# Patient Record
Sex: Female | Born: 1975 | Hispanic: Yes | Marital: Married | Smoking: Never smoker
Health system: Southern US, Community
[De-identification: ages and names within clinical notes are randomized; demographics above are authoritative.]

## PROBLEM LIST (undated history)

## (undated) DIAGNOSIS — N6320 Unspecified lump in the left breast, unspecified quadrant: Secondary | ICD-10-CM

## (undated) HISTORY — DX: Unspecified lump in the left breast, unspecified quadrant: N63.20

---

## 2010-08-28 HISTORY — PX: OOPHORECTOMY: SHX86

## 2016-08-28 DIAGNOSIS — N6320 Unspecified lump in the left breast, unspecified quadrant: Secondary | ICD-10-CM

## 2016-08-28 HISTORY — DX: Unspecified lump in the left breast, unspecified quadrant: N63.20

## 2016-11-01 ENCOUNTER — Ambulatory Visit: Payer: Self-pay | Attending: Oncology | Admitting: *Deleted

## 2016-11-01 ENCOUNTER — Encounter: Payer: Self-pay | Admitting: *Deleted

## 2016-11-01 VITALS — BP 122/78 | HR 70 | Temp 96.7°F | Resp 18 | Ht 61.42 in | Wt 199.6 lb

## 2016-11-01 DIAGNOSIS — N6452 Nipple discharge: Secondary | ICD-10-CM

## 2016-11-01 DIAGNOSIS — Z0000 Encounter for general adult medical examination without abnormal findings: Secondary | ICD-10-CM

## 2016-11-01 NOTE — Patient Instructions (Signed)
Prueba del VPH (HPV Test) La prueba del virus del papiloma humano (VPH) se [place] para Hydrographic surveyor los tipos de infeccin por el VPH de Public affairs consultant. El VPH es un grupo de alrededor de 100 virus. Muchos de estos virus causan tumores dentro de los genitales, sobre ellos o a su alrededor. La mayora de los VPH provocan infecciones que suelen desaparecen sin tratamiento. Sin embargo, los tipos 6, 64, 46 y 73 del VPH se consideran de Public affairs consultant y pueden aumentar el riesgo de Chief Financial Officer de cuello del tero o de ano si la infeccin no se trata. La prueba del VPH identifica las cadenas de ADN (genticas) de la infeccin por el VPH, por lo que tambin se denomina prueba de [place] para [employer]. Aunque el VPH se Secretary/administrator en los hombres como en las mujeres, la prueba del VPH se [place] solo para Hydrographic surveyor un mayor riesgo de cncer en las mujeres:  Con una prueba de Papanicolaou anormal.  Despus del tratamiento de una prueba de Papanicolaou anormal.  Entre 30y 65aos.  Despus del tratamiento de una infeccin por el VPH de [employer]. La prueba del VPH se puede hacer al mismo tiempo que un examen plvico y Ardelia Mems prueba de Papanicolaou en mujeres de ms de 30 aos. Tanto la prueba del VPH como la prueba de Papanicolaou requieren Truddie Coco de clulas del cuello del tero. PREPARACIN PARA EL ESTUDIO  No se haga lavados vaginales ni se d un bao durante 24 a 48 horas antes de la prueba o como se lo haya indicado el mdico.  No tenga sexo durante 24 a 48 horas antes de la prueba o como se lo haya indicado el mdico.  Es posible que se le pida que reprograme la prueba si est menstruando.  Se le pedir que orine antes de la prueba. RESULTADOS Es su responsabilidad retirar el resultado del [place]. Consulte en el laboratorio o en el departamento en el que fue realizado el estudio cundo y cmo podr [employer]. Hable con el mdico si tiene [employer]. El resultado ser  negativo o positivo. Significado de los [employer] de la prueba Un resultado negativo de la prueba del VPH significa que no se detect el VPH, y es muy probable que no tenga el virus. Significado de los [employer] de la prueba Un resultado positivo de la prueba del VPH indica que tiene el virus.  Si el resultado de la prueba [place] la presencia de [place] cadena del VPH de alto riesgo, puede tener mayor riesgo de Chief Financial Officer de cuello del tero o de ano si la infeccin no se trata.  Si se encuentran cadenas del VPH de bajo riesgo, es poco probable que tenga un alto riesgo de Chief Financial Officer. Hable con el mdico [employer]. El mdico [employer] para Optometrist un diagnstico y Teacher, adult education un plan de tratamiento adecuado para usted. Esta informacin no tiene Marine scientist el consejo del mdico. Asegrese de hacerle al mdico cualquier pregunta que tenga. Document Released: 11/30/2008 Document Revised: 09/04/2014 Document Reviewed: 12/30/2013 Elsevier Interactive Patient Education  2017 [place] patient hand-out, Women Staying Healthy, Active and Well from [employer], with education on breast health, pap smears, heart and colon health.

## 2016-11-01 NOTE — Progress Notes (Signed)
Subjective:     Patient ID: Kirsten Martin, female   DOB: 1976/02/02, 41 y.o.   MRN: [id number]  HPI   Review of Systems     Objective:   Physical Exam  Pulmonary/Chest: Right breast exhibits no inverted nipple, no mass, no nipple discharge, no skin change and no tenderness. Left breast exhibits nipple discharge. Left breast exhibits no inverted nipple, no mass, no skin change and no tenderness. Breasts are symmetrical.  Patient states spontaneous yellow nipple discharge from the left breast for 6 months  Abdominal: There is no splenomegaly or hepatomegaly.  Genitourinary: No labial fusion. There is no rash, tenderness, lesion or injury on the right labia. There is no rash, tenderness, lesion or injury on the left labia. Cervix exhibits no motion tenderness, no discharge and no friability. Right adnexum displays no mass, no tenderness and no fullness. Left adnexum displays no mass, no tenderness and no fullness. No erythema, tenderness or bleeding in the vagina. No foreign body in the vagina. No signs of injury around the vagina. No vaginal discharge found.       Assessment:     41 year old Hispanic female presents to [facility] for clinical breast exam, mammogram and pap smear.  [place], the interpreter present during the interview and exam.  Patient states she has had spontaneous yellow left nipple discharge for about 6 months.  States she finds her bras wet, and her night clothes wet at her left breast after sleeping through the night.  On clinical breast exam there is no dominant mass, skin changes or nipple discharge.  Taught self breast awareness.  Last pap was completed 2015.  There are no pap results for review, so I will proceed with collecting her pap smear today.  Specimen collected for pap.  Patient has been screened for eligibility.  She does not have any insurance, Medicare or Medicaid.  She also meets financial eligibility.  Hand-out given on the Affordable Care Act.    Plan:       Bilateral diagnositic mammogram and ultrasound ordered for her left nipple discharge.  Jeanella Anton to schedule patient her appointment.  Specimen for pap sent to the lab.  Will follow-up per [facility] protocol.

## 2016-11-03 LAB — PAP LB AND HPV HIGH-RISK
HPV, HIGH-RISK: NEGATIVE
PAP Smear Comment: 0

## 2016-11-10 ENCOUNTER — Ambulatory Visit
Admission: RE | Admit: 2016-11-10 | Discharge: 2016-11-10 | Disposition: A | Discharge status: Home / Self Care | Payer: Self-pay | Source: Ambulatory Visit | Attending: Oncology | Admitting: Oncology

## 2016-11-10 DIAGNOSIS — N6452 Nipple discharge: Secondary | ICD-10-CM

## 2016-11-13 ENCOUNTER — Other Ambulatory Visit: Payer: Self-pay | Admitting: *Deleted

## 2016-11-13 DIAGNOSIS — N63 Unspecified lump in unspecified breast: Secondary | ICD-10-CM

## 2016-11-22 ENCOUNTER — Ambulatory Visit
Admission: RE | Admit: 2016-11-22 | Discharge: 2016-11-22 | Disposition: A | Discharge status: Home / Self Care | Payer: Self-pay | Source: Ambulatory Visit | Attending: Oncology | Admitting: Oncology

## 2016-11-22 DIAGNOSIS — N63 Unspecified lump in unspecified breast: Secondary | ICD-10-CM

## 2016-11-22 HISTORY — PX: BREAST BIOPSY: SHX20

## 2016-11-23 LAB — SURGICAL PATHOLOGY

## 2016-11-27 ENCOUNTER — Telehealth: Payer: Self-pay | Admitting: *Deleted

## 2016-11-27 NOTE — Telephone Encounter (Signed)
Called patient via Herb Grays, the interpreter.  Reviewed her benign biopsy results and need for a surgical consult.  Patient is scheduled to see Dr. Jamal Collin on 11/29/16 at 1:15  She is to arrive 30 minutes early to complete paperwork.  She is to take a photo ID and all her meds with her to the appointment.  She is to call if she has any questions or needs.

## 2016-11-29 ENCOUNTER — Encounter: Payer: Self-pay | Admitting: General Surgery

## 2016-11-29 ENCOUNTER — Ambulatory Visit (INDEPENDENT_AMBULATORY_CARE_PROVIDER_SITE_OTHER): Payer: PRIVATE HEALTH INSURANCE | Admitting: General Surgery

## 2016-11-29 ENCOUNTER — Other Ambulatory Visit: Payer: Self-pay | Admitting: *Deleted

## 2016-11-29 ENCOUNTER — Telehealth: Payer: Self-pay | Admitting: *Deleted

## 2016-11-29 VITALS — BP 132/78 | HR 74 | Resp 12 | Ht 63.0 in | Wt 201.0 lb

## 2016-11-29 DIAGNOSIS — N6321 Unspecified lump in the left breast, upper outer quadrant: Secondary | ICD-10-CM

## 2016-11-29 DIAGNOSIS — N63 Unspecified lump in unspecified breast: Secondary | ICD-10-CM

## 2016-11-29 DIAGNOSIS — N6320 Unspecified lump in the left breast, unspecified quadrant: Secondary | ICD-10-CM

## 2016-11-29 NOTE — Progress Notes (Signed)
Patient ID: Kirsten Martin, female   DOB: 06-May-1976, 41 y.o.   MRN: [id number]  Chief Complaint  Patient presents with  . Other    biopsy done 11/22/16    HPI Kirsten Martin is a 41 y.o. female who presents for a breast evaluation. The most recent mammogram was done on 11/10/16. Patient reports she had some left nipple discharge for about 6 months this occurred daily. It first started out as yellow discharge then changed to red/bloody. Patient does perform regular self breast checks and gets regular mammograms done. Daughter Kirsten Martin is present. She had a biopsy done 11/22/16 which she is here to discuss recommendations.   Denies family history of cancer.  [place] with [facility]S present for interview exam and discussion.       HPI  No past medical history on file.  Past Surgical History:  Procedure Laterality Date  . BREAST BIOPSY Left 11/22/2016   [place] bx of 2 areas. Path pending  . OOPHORECTOMY Left 2012   [facility]    No family history on file.  Social History Social History  Substance Use Topics  . Smoking status: Never Smoker  . Smokeless tobacco: Never Used  . Alcohol use No    No Known Allergies  No current outpatient prescriptions on file.   No current facility-administered medications for this visit.     Review of Systems Review of Systems  Constitutional: Negative.   Respiratory: Negative.   Cardiovascular: Negative.     Blood pressure 132/78, pulse 74, resp. rate 12, height 5\' 3"  (1.6 m), weight 201 lb (91.2 kg).  Physical Exam Physical Exam  Constitutional: She is oriented to person, place, and time. She appears well-developed and well-nourished.  Eyes: Conjunctivae are normal. No scleral icterus.  Cardiovascular: Normal rate, regular rhythm and normal heart sounds.   Pulmonary/Chest: Effort normal and breath sounds normal. Right breast exhibits no inverted nipple, no mass, no nipple discharge, no skin change and no  tenderness. Left breast exhibits no inverted nipple, no mass, no nipple discharge, no skin change and no tenderness.  Well healed incision   Lymphadenopathy:    She has no cervical adenopathy.    She has no axillary adenopathy.  Neurological: She is alert and oriented to person, place, and time.  Skin: Skin is warm and dry.    Data Reviewed Mammogram and ultrasound reviewed 2 areas of distortion seen on mammogram of left breast. Larger area was seen on [place] but not the smaller area. 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 [place] also showed large duct associated with this mass. The smaller area of distortion was not seen on [place] and needs to be biopsied as there was more concern for this to be malignant Assessment    Nipple discharge. Intraductal papilloma    Plan    Advised patient on need for tomo guided biopsy on smaller mass located at 12 o'clock. Explained procedure and need for excision of area of papilloma    also.  Follow up after procedure.      This information has been scribed by Verlene Mayer, CMA   I have completed the exam and reviewed the above documentation for accuracy and completeness.  I agree with the above.  Haematologist has been used and any errors in dictation or transcription are unintentional.  Seeplaputhur G. Jamal Collin, M.D., F.A.C.S.  Junie Panning G 12/04/2016, 2:52 PM

## 2016-11-29 NOTE — Patient Instructions (Addendum)
Schedule tomo-guided biopsy

## 2016-11-29 NOTE — Telephone Encounter (Signed)
Message left for daughter,Jessica, to call the office regarding patient's upcoming appointments.

## 2016-11-29 NOTE — Telephone Encounter (Signed)
Patient's daughter called back and was notified of biopsy date and time as well as follow up appointment with Dr. Jamal Collin.

## 2016-12-07 ENCOUNTER — Ambulatory Visit
Admission: RE | Admit: 2016-12-07 | Discharge: 2016-12-07 | Disposition: A | Discharge status: Home / Self Care | Payer: Self-pay | Source: Ambulatory Visit | Attending: Oncology | Admitting: Oncology

## 2016-12-07 DIAGNOSIS — N63 Unspecified lump in unspecified breast: Secondary | ICD-10-CM

## 2016-12-07 HISTORY — PX: BREAST BIOPSY: SHX20

## 2016-12-08 LAB — SURGICAL PATHOLOGY

## 2016-12-11 ENCOUNTER — Encounter: Payer: Self-pay | Admitting: General Surgery

## 2016-12-11 ENCOUNTER — Ambulatory Visit (INDEPENDENT_AMBULATORY_CARE_PROVIDER_SITE_OTHER): Payer: PRIVATE HEALTH INSURANCE | Admitting: General Surgery

## 2016-12-11 VITALS — BP 120/78 | HR 80 | Resp 12 | Ht 60.0 in | Wt 202.0 lb

## 2016-12-11 DIAGNOSIS — D242 Benign neoplasm of left breast: Secondary | ICD-10-CM | POA: Diagnosis not present

## 2016-12-11 NOTE — Progress Notes (Signed)
Patient ID: Kirsten Martin, female   DOB: 1975/11/13, 41 y.o.   MRN: [id number]  Chief Complaint  Patient presents with  . Follow-up    HPI Kirsten Martin is a 41 y.o. female here today for her follow up left breast biopsy done on 12/07/2016. Patient states she is doing well. Denies pain, fever, nausea. She feels a hardened area where the biopsy was done. Interpreter Ronnald Collum is present at visit.  HPI  No past medical history on file.  Past Surgical History:  Procedure Laterality Date  . BREAST BIOPSY Left 11/22/2016   [place] bx of 2 areas. Path pending  . BREAST BIOPSY Left 12/07/2016   path pending  . OOPHORECTOMY Left 2012   [facility]    No family history on file.  Social History Social History  Substance Use Topics  . Smoking status: Never Smoker  . Smokeless tobacco: Never Used  . Alcohol use No    No Known Allergies  No current outpatient prescriptions on file.   No current facility-administered medications for this visit.     Review of Systems Review of Systems  Constitutional: Negative.   Respiratory: Negative.   Cardiovascular: Negative.     Blood pressure 120/78, pulse 80, resp. rate 12, height 5' (1.524 m), weight 202 lb (91.6 kg), last menstrual period 12/04/2016.  Physical Exam Physical Exam  Constitutional: She appears well-developed and well-nourished.  Pulmonary/Chest:    Abdominal: Soft.    Data Reviewed Pathology, left breast UIQ tomographic guided stereotactic biopsy from 12/07/16 showed [place] and columnar cell changes Pathology, US guided biopsy from 11/22/16: left breast 11:00 showed [place] and columnar cell change Pathology, US guided biopsy from 11/22/16: left breast 12:00 showed intraductal papilloma  Assessment    The only area of concern is the lesion at 12:00 showing a papilloma. This does warrant excision, however with the current hematoma would prefer to wait until the hematoma resolves to perform the surgery. This was all discussed  fully with the radiologist.    Patient is agreeable.  Plan    Patient to return in 1 month. Advised the use of heat and a snug bra, and to call if there's any worsening of the swelling in the left breast.       I have completed the exam and reviewed the above documentation for accuracy and completeness.  I agree with the above.  Haematologist has been used and any errors in dictation or transcription are unintentional.  Seeplaputhur G. Jamal Collin, M.D., F.A.C.S.  Junie Panning G 12/11/2016, 3:06 PM

## 2017-01-16 ENCOUNTER — Encounter: Payer: Self-pay | Admitting: General Surgery

## 2017-01-16 ENCOUNTER — Ambulatory Visit (INDEPENDENT_AMBULATORY_CARE_PROVIDER_SITE_OTHER): Payer: Self-pay | Admitting: General Surgery

## 2017-01-16 VITALS — BP 124/86 | HR 76 | Resp 12 | Ht 60.0 in | Wt 199.0 lb

## 2017-01-16 DIAGNOSIS — N6452 Nipple discharge: Secondary | ICD-10-CM

## 2017-01-16 DIAGNOSIS — D369 Benign neoplasm, unspecified site: Secondary | ICD-10-CM

## 2017-01-16 NOTE — Patient Instructions (Addendum)
The patient is aware to call back for any questions or concerns.  The patient is scheduled for surgery at [facility] on 01/26/17. She will pre admit at the hospital on 01/23/17. The patient is aware of dates, time, and instructions.

## 2017-01-16 NOTE — Progress Notes (Signed)
Patient ID: Kirsten Martin, female   DOB: 1975-09-08, 41 y.o.   MRN: [id number]  Chief Complaint  Patient presents with  . Follow-up    HPI Kirsten Martin is a 41 y.o. female.  Here today for follow up left breat biopsy. She states the area is sore. Slight nipple discharge last week.  Interpreter, Chip Boer present for interview, exam and discussion.  HPI  Past Medical History:  Diagnosis Date  . Breast mass, left 2018    Past Surgical History:  Procedure Laterality Date  . BREAST BIOPSY Left 11/22/2016   [place] bx of 2 areas. FRAGMENTS OF SCLEROSING INTRADUCTAL PAPILLOMA.  Marland Kitchen BREAST BIOPSY Left 12/07/2016   PSEUDO-ANGIOMATOUS STROMAL HYPERPLASIA  . OOPHORECTOMY Left 2012   [facility]    No family history on file.  Social History Social History  Substance Use Topics  . Smoking status: Never Smoker  . Smokeless tobacco: Never Used  . Alcohol use No    No Known Allergies  No current outpatient prescriptions on file.   No current facility-administered medications for this visit.     Review of Systems Review of Systems  Constitutional: Negative.   Respiratory: Negative.   Cardiovascular: Negative.     Blood pressure 124/86, pulse 76, resp. rate 12, height 5' (1.524 m), weight 199 lb (90.3 kg), last menstrual period 11/30/2016.  Physical Exam Physical Exam  Constitutional: She is oriented to person, place, and time. She appears well-developed and well-nourished.  HENT:  Mouth/Throat: Oropharynx is clear and moist.  Eyes: Conjunctivae are normal. No scleral icterus.  Neck: Neck supple.  Cardiovascular: Normal rate, regular rhythm and normal heart sounds.   Pulmonary/Chest: Effort normal and breath sounds normal. Right breast exhibits no inverted nipple, no mass, no nipple discharge, no skin change and no tenderness. Left breast exhibits no inverted nipple, no mass, no nipple discharge, no skin change and no tenderness.  Abdominal: Soft.  Lymphadenopathy:    She has  no cervical adenopathy.    She has no axillary adenopathy.  Neurological: She is alert and oriented to person, place, and time.  Skin: Skin is warm and dry.  Psychiatric: Her behavior is normal.  Hematoma from recent left breast biopsy at 11-12 ocl location has resolved  Data Reviewed Progress notes and previous pathology.   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   Pt is agreeable.  HPI, Physical Exam, Assessment and Plan have been scribed under the direction and in the presence of Mckinley Jewel, MD  Karie Fetch, RN I have completed the exam and reviewed the above documentation for accuracy and completeness.  I agree with the above.  Haematologist has been used and any errors in dictation or transcription are unintentional.  Atziri Zubiate G. Jamal Collin, M.D., F.A.C.S.   Junie Panning G 01/17/2017, 8:45 AM

## 2017-01-17 ENCOUNTER — Other Ambulatory Visit: Payer: Self-pay | Admitting: General Surgery

## 2017-01-17 ENCOUNTER — Telehealth: Payer: Self-pay | Admitting: *Deleted

## 2017-01-17 DIAGNOSIS — D369 Benign neoplasm, unspecified site: Secondary | ICD-10-CM

## 2017-01-17 NOTE — Telephone Encounter (Signed)
-----   Message from Sherrie Sport sent at 01/17/2017  9:29 AM EDT ----- Regarding: RE: Wire Loc  8:15am check in at [facility].  I will request an interpreter.  Thanks  ----- Message ----- From: Lesly Rubenstein, LPN Sent: [id number]   4:00 PM To: Dominga Ferry, CMA, Ileene Hutchinson, RT, # Subject: Wire Loc                                       Patient is scheduled for excision of left breast mass and left wire loc on 01/26/17. Surgery time is 11:35 am. Just need to know what time she needs to report to [facility]. Also patient will need a spanish interpreter.

## 2017-01-17 NOTE — Addendum Note (Signed)
Addended by: Christene Lye on: 01/17/2017 01:39 PM   Modules accepted: Orders, SmartSet

## 2017-01-17 NOTE — Telephone Encounter (Signed)
Patient notified of surgery date and time via [employer] [phone].   Also, aware of pre-admission date and time.   She verbalizes understanding. No questions at this time.

## 2017-01-23 ENCOUNTER — Encounter
Admission: RE | Admit: 2017-01-23 | Discharge: 2017-01-23 | Disposition: A | Discharge status: Home / Self Care | Payer: Self-pay | Source: Ambulatory Visit | Attending: General Surgery | Admitting: General Surgery

## 2017-01-23 DIAGNOSIS — Z01818 Encounter for other preprocedural examination: Secondary | ICD-10-CM | POA: Insufficient documentation

## 2017-01-23 DIAGNOSIS — D242 Benign neoplasm of left breast: Secondary | ICD-10-CM | POA: Insufficient documentation

## 2017-01-23 LAB — HEMOGLOBIN: HEMOGLOBIN: 14.2 g/dL (ref 12.0–16.0)

## 2017-01-23 NOTE — Patient Instructions (Signed)
Your procedure is scheduled on: 01/26/17 Su procedimiento est programado para: Report to [place] [facility]:   Remember: Instructions that are not followed completely may result in serious medical risk, up to and including death, or upon the discretion of your surgeon and anesthesiologist your surgery may need to be rescheduled.  Recuerde: Las instrucciones que no se siguen completamente Heritage manager en un riesgo de salud grave, incluyendo hasta la [place] o a discrecin de su cirujano y Environmental health practitioner, su ciruga se puede posponer.   __X__ 1. Do not eat food or drink liquids after midnight. No gum chewing or hard candies.  No coma alimentos ni tome lquidos despus de la medianoche.  No mastique chicle ni caramelos  duros.     __X__ 2. No alcohol for 24 hours before or after surgery.    No tome alcohol durante las 24 horas antes ni despus de la [place].   ____ 3. Bring all medications with you on the day of surgery if instructed.    Lleve todos los medicamentos con usted el da de su ciruga si se le ha indicado as.   __X__ 4. Notify your doctor if there is any change in your medical condition (cold, fever,                             infections).    Informe a su mdico si hay algn cambio en su condicin mdica (resfriado, fiebre, infecciones).   Do not wear jewelry, make-up, hairpins, clips or nail polish.  No use joyas, maquillajes, pinzas/ganchos para el cabello ni esmalte de uas.  Do not wear lotions, powders, or perfumes. You may wear deodorant.  No use lociones, polvos o perfumes.  Puede usar desodorante.    Do not shave 48 hours prior to surgery. Men may shave face and neck.  No se afeite 48 horas antes de la [place].  Los hombres pueden [employer] cara y el cuello.   Do not bring valuables to the hospital.   No lleve objetos [facility] is not responsible for any belongings or valuables.  [place] no se  hace responsable de ningn tipo de pertenencias u objetos de Geographical information systems officer.               Contacts, dentures or bridgework may not be worn into surgery.  Los lentes de [place], las dentaduras postizas o puentes no se pueden usar en la [place].  Leave your suitcase in the car. After surgery it may be brought to your room.  Deje su maleta en el auto.  Despus de la ciruga podr traerla a su habitacin.  For patients admitted to the hospital, discharge time is determined by your treatment team.  Para los pacientes que sean ingresados al hospital, el tiempo en el cual se le dar de alta es determinado por su                equipo de [place].   Patients discharged the day of surgery will not be allowed to drive home. A los pacientes que se les da de alta el mismo da de la ciruga no se les permitir conducir a Holiday representative.   Please read over the following fact sheets that you were given: Por favor [place] hojas de informacin que le dieron:     __X__ Take these medicines the morning of surgery with A SIP  OF WATER:          [employer] medicinas la maana de la ciruga con UN SORBO DE AGUA:  1. NONE NADA  2.   3.   4.       5.  6.  ____ Fleet Enema (as directed)          Enema de Fleet (segn lo indicado)    __X__ Use CHG Soap as directed          Utilice el jabn de CHG segn lo indicado  ____ Use inhalers on the day of surgery          Use los inhaladores el da de la ciruga  ____ Stop metformin 2 days prior to surgery          Deje de tomar el metformin 2 das antes de la ciruga    ____ Take 1/2 of usual insulin dose the night before surgery and none on the morning of surgery           Tome la mitad de la dosis habitual de insulina la noche antes de la [place] y no tome nada en la maana de la             ciruga  ____ Stop Coumadin/Plavix/aspirin on           Deje de tomar el Coumadin/Plavix/aspirina el da:  ____ Stop Anti-inflammatories on           Deje de tomar  antiinflamatorios el da:   ____ Stop supplements until after surgery            Deje de tomar suplementos hasta despus de la ciruga  ____ Bring C-Pap to the hospital          [place] al hospital

## 2017-01-26 ENCOUNTER — Ambulatory Visit
Admission: RE | Admit: 2017-01-26 | Discharge: 2017-01-26 | Disposition: A | Discharge status: Home / Self Care | Payer: Self-pay | Source: Ambulatory Visit | Attending: General Surgery | Admitting: General Surgery

## 2017-01-26 ENCOUNTER — Ambulatory Visit: Payer: Self-pay | Admitting: Anesthesiology

## 2017-01-26 ENCOUNTER — Encounter: Payer: Self-pay | Admitting: Anesthesiology

## 2017-01-26 ENCOUNTER — Encounter
Admission: RE | Disposition: A | Discharge status: Home / Self Care | Payer: Self-pay | Source: Ambulatory Visit | Attending: General Surgery

## 2017-01-26 DIAGNOSIS — D369 Benign neoplasm, unspecified site: Secondary | ICD-10-CM

## 2017-01-26 DIAGNOSIS — D242 Benign neoplasm of left breast: Secondary | ICD-10-CM | POA: Insufficient documentation

## 2017-01-26 HISTORY — PX: BREAST LUMPECTOMY WITH NEEDLE LOCALIZATION: SHX5759

## 2017-01-26 HISTORY — PX: BREAST LUMPECTOMY: SHX2

## 2017-01-26 LAB — POCT PREGNANCY, URINE: Preg Test, Ur: NEGATIVE

## 2017-01-26 SURGERY — BREAST LUMPECTOMY WITH NEEDLE LOCALIZATION
Anesthesia: General | Laterality: Left | Wound class: Clean

## 2017-01-26 MED ORDER — GLYCOPYRROLATE 0.2 MG/ML IJ SOLN
INTRAMUSCULAR | Status: AC
  Filled 2017-01-26: qty 1

## 2017-01-26 MED ORDER — PROPOFOL 10 MG/ML IV BOLUS
INTRAVENOUS | Status: AC
  Filled 2017-01-26: qty 20

## 2017-01-26 MED ORDER — LIDOCAINE HCL (CARDIAC) 20 MG/ML IV SOLN
INTRAVENOUS | Status: DC | PRN
  Administered 2017-01-26: 100 mg via INTRAVENOUS

## 2017-01-26 MED ORDER — OXYCODONE HCL 5 MG/5ML PO SOLN: 5.0000 mg | Freq: Once | ORAL | Status: DC | PRN

## 2017-01-26 MED ORDER — GLYCOPYRROLATE 0.2 MG/ML IJ SOLN
INTRAMUSCULAR | Status: DC | PRN
  Administered 2017-01-26: 0.2 mg via INTRAVENOUS

## 2017-01-26 MED ORDER — LACTATED RINGERS IV SOLN
INTRAVENOUS | Status: DC
  Administered 2017-01-26: 10:00:00 via INTRAVENOUS

## 2017-01-26 MED ORDER — MIDAZOLAM HCL 2 MG/2ML IJ SOLN
INTRAMUSCULAR | Status: DC | PRN
  Administered 2017-01-26: 2 mg via INTRAVENOUS

## 2017-01-26 MED ORDER — FAMOTIDINE 20 MG PO TABS
20.0000 mg | ORAL_TABLET | Freq: Once | ORAL | Status: AC
  Administered 2017-01-26: 20 mg via ORAL

## 2017-01-26 MED ORDER — ACETAMINOPHEN 10 MG/ML IV SOLN
INTRAVENOUS | Status: AC
  Filled 2017-01-26: qty 100

## 2017-01-26 MED ORDER — ONDANSETRON HCL 4 MG/2ML IJ SOLN
INTRAMUSCULAR | Status: DC | PRN
  Administered 2017-01-26: 4 mg via INTRAVENOUS

## 2017-01-26 MED ORDER — MIDAZOLAM HCL 2 MG/2ML IJ SOLN
INTRAMUSCULAR | Status: AC
  Filled 2017-01-26: qty 2

## 2017-01-26 MED ORDER — FAMOTIDINE 20 MG PO TABS
ORAL_TABLET | ORAL | Status: AC
  Filled 2017-01-26: qty 1

## 2017-01-26 MED ORDER — BUPIVACAINE HCL (PF) 0.5 % IJ SOLN
INTRAMUSCULAR | Status: DC | PRN
  Administered 2017-01-26: 20 mL

## 2017-01-26 MED ORDER — FENTANYL CITRATE (PF) 100 MCG/2ML IJ SOLN
INTRAMUSCULAR | Status: DC | PRN
  Administered 2017-01-26: 100 ug via INTRAVENOUS

## 2017-01-26 MED ORDER — DEXAMETHASONE SODIUM PHOSPHATE 10 MG/ML IJ SOLN
INTRAMUSCULAR | Status: DC | PRN
  Administered 2017-01-26: 10 mg via INTRAVENOUS

## 2017-01-26 MED ORDER — LIDOCAINE HCL (PF) 2 % IJ SOLN
INTRAMUSCULAR | Status: AC
  Filled 2017-01-26: qty 2

## 2017-01-26 MED ORDER — PROPOFOL 10 MG/ML IV BOLUS
INTRAVENOUS | Status: DC | PRN
  Administered 2017-01-26: 150 mg via INTRAVENOUS

## 2017-01-26 MED ORDER — FENTANYL CITRATE (PF) 100 MCG/2ML IJ SOLN
INTRAMUSCULAR | Status: AC
  Filled 2017-01-26: qty 2

## 2017-01-26 MED ORDER — FENTANYL CITRATE (PF) 100 MCG/2ML IJ SOLN
25.0000 ug | INTRAMUSCULAR | Status: DC | PRN
  Administered 2017-01-26 (×4): 25 ug via INTRAVENOUS

## 2017-01-26 MED ORDER — BUPIVACAINE HCL (PF) 0.5 % IJ SOLN
INTRAMUSCULAR | Status: AC
  Filled 2017-01-26: qty 30

## 2017-01-26 MED ORDER — ONDANSETRON HCL 4 MG/2ML IJ SOLN
INTRAMUSCULAR | Status: AC
  Filled 2017-01-26: qty 2

## 2017-01-26 MED ORDER — CHLORHEXIDINE GLUCONATE CLOTH 2 % EX PADS
6.0000 | MEDICATED_PAD | Freq: Once | CUTANEOUS | Status: AC
  Administered 2017-01-26: 6 via TOPICAL

## 2017-01-26 MED ORDER — TRAMADOL HCL 50 MG PO TABS: 50.0000 mg | ORAL_TABLET | Freq: Four times a day (QID) | ORAL | 0 refills | Status: DC | PRN

## 2017-01-26 MED ORDER — DEXAMETHASONE SODIUM PHOSPHATE 10 MG/ML IJ SOLN
INTRAMUSCULAR | Status: AC
  Filled 2017-01-26: qty 1

## 2017-01-26 MED ORDER — OXYCODONE HCL 5 MG PO TABS: 5.0000 mg | ORAL_TABLET | Freq: Once | ORAL | Status: DC | PRN

## 2017-01-26 MED ORDER — ACETAMINOPHEN 10 MG/ML IV SOLN
INTRAVENOUS | Status: DC | PRN
  Administered 2017-01-26: 1000 mg via INTRAVENOUS

## 2017-01-26 MED ORDER — PHENYLEPHRINE HCL 10 MG/ML IJ SOLN
INTRAMUSCULAR | Status: DC | PRN
  Administered 2017-01-26 (×5): 100 ug via INTRAVENOUS

## 2017-01-26 MED ORDER — LIDOCAINE HCL (PF) 1 % IJ SOLN
INTRAMUSCULAR | Status: AC
  Filled 2017-01-26: qty 30

## 2017-01-26 SURGICAL SUPPLY — 36 items
BLADE SURG 15 STRL SS SAFETY (BLADE) ×2 IMPLANT
CANISTER SUCT 1200ML W/VALVE (MISCELLANEOUS) ×2 IMPLANT
CHLORAPREP W/TINT 26ML (MISCELLANEOUS) ×2 IMPLANT
COVER PROBE FLX POLY STRL (MISCELLANEOUS) ×2 IMPLANT
DERMABOND ADVANCED (GAUZE/BANDAGES/DRESSINGS) ×1
DERMABOND ADVANCED .7 DNX12 (GAUZE/BANDAGES/DRESSINGS) ×1 IMPLANT
DEVICE DUBIN SPECIMEN MAMMOGRA (MISCELLANEOUS) ×2 IMPLANT
DEVICE LOCALIZATION ULTRAWIRE (WIRE) IMPLANT
DEVICE ULTRAWIRE LOCAL 19X9 (WIRE) IMPLANT
DRAPE LAPAROTOMY 100X77 ABD (DRAPES) ×2 IMPLANT
ELECT CAUTERY BLADE TIP 2.5 (TIP) ×2
ELECT REM PT RETURN 9FT ADLT (ELECTROSURGICAL) ×2
ELECTRODE CAUTERY BLDE TIP 2.5 (TIP) ×1 IMPLANT
ELECTRODE REM PT RTRN 9FT ADLT (ELECTROSURGICAL) ×1 IMPLANT
GLOVE BIO SURGEON STRL SZ7 (GLOVE) ×16 IMPLANT
GOWN STRL REUS W/ TWL LRG LVL3 (GOWN DISPOSABLE) ×4 IMPLANT
GOWN STRL REUS W/TWL LRG LVL3 (GOWN DISPOSABLE) ×4
KIT RM TURNOVER STRD PROC AR (KITS) ×2 IMPLANT
LABEL OR SOLS (LABEL) ×2 IMPLANT
MARGIN MAP 10MM (MISCELLANEOUS) ×2 IMPLANT
NDL HPO THNWL 1X22GA REG BVL (NEEDLE) ×1 IMPLANT
NEEDLE HYPO 25X1 1.5 SAFETY (NEEDLE) ×2 IMPLANT
NEEDLE SAFETY 22GX1 (NEEDLE) ×1
PACK BASIN MINOR ARMC (MISCELLANEOUS) ×2 IMPLANT
SUT ETHILON 3-0 FS-10 30 BLK (SUTURE) ×2
SUT VIC AB 2-0 CT2 27 (SUTURE) ×2 IMPLANT
SUT VIC AB 2-0 SH 27 (SUTURE) ×1
SUT VIC AB 2-0 SH 27XBRD (SUTURE) ×1 IMPLANT
SUT VIC AB 3-0 54X BRD REEL (SUTURE) ×1 IMPLANT
SUT VIC AB 3-0 BRD 54 (SUTURE) ×1
SUT VIC AB 4-0 FS2 27 (SUTURE) ×2 IMPLANT
SUTURE EHLN 3-0 FS-10 30 BLK (SUTURE) ×1 IMPLANT
SYR CONTROL 10ML (SYRINGE) ×2 IMPLANT
ULTRAWIRE LOCAL DEVICE 19X9 (WIRE)
ULTRAWIRE LOCALIZATION DEVICE (WIRE)
WATER STERILE IRR 1000ML POUR (IV SOLUTION) ×2 IMPLANT

## 2017-01-26 NOTE — Transfer of Care (Signed)
Immediate Anesthesia Transfer of Care Note  Patient: Kirsten Martin  Procedure(s) Performed: Procedure(s): BREAST LUMPECTOMY WITH NEEDLE LOCALIZATION (Left)  Patient Location: PACU  Anesthesia Type:General  Level of Consciousness: sedated  Airway & Oxygen Therapy: Patient Spontanous Breathing and Patient connected to face mask oxygen  Post-op Assessment: Report given to RN and Post -op Vital signs reviewed and stable  Post vital signs: Reviewed and stable  Last Vitals:  Vitals:   01/26/17 0928 01/26/17 1106  BP: 128/72 (P) 129/80  Pulse: (!) 56   Resp: 16 (P) 20  Temp: 36.8 C (P) 36.1 C    Last Pain: There were no vitals filed for this visit.       Complications: No apparent anesthesia complications

## 2017-01-26 NOTE — Interval H&P Note (Signed)
History and Physical Interval Note:  01/26/2017 9:26 AM  Kirsten Martin  has presented today for surgery, with the diagnosis of papilloma  The various methods of treatment have been discussed with the patient and family. After consideration of risks, benefits and other options for treatment, the patient has consented to  Procedure(s): BREAST LUMPECTOMY WITH NEEDLE LOCALIZATION (Left) as a surgical intervention .  The patient's history has been reviewed, patient examined, no change in status, stable for surgery.  I have reviewed the patient's chart and labs.  Questions were answered to the patient's satisfaction.     SANKAR,SEEPLAPUTHUR G

## 2017-01-26 NOTE — Op Note (Signed)
Preop diagnosis: Intraductal papilloma left breast  Post op diagnosis: Same  Operation: Excision/lumpectomy left breast 12:00 location  Surgeon: Mckinley Jewel  Assistant:     Anesthesia: Gen.  Complications: None  EBL: Minimal  Drains: None  Description: This patient had previously undergone core biopsies of multiple lesions in the left breast. The lesion located at 12:00 5 cm from the nipple was reported as a papilloma and excision of therefore planned. The patient preoperatively underwent wire localization of the wing-shaped clip denoting the location of the papilloma in the superior left breast. She was brought the operating room put to sleep with an LMA and the left breast prepped and draped as sterile field. Timeout was performed. A curvilinear incision just below the wire entrance was made and the skin and subcutaneous tissue with an elevated on both sides. The wire was freed from the skin and subcutaneous tissue. With the help of the the image after wire placement and palpation and visualization and a core of tissue surrounding the wire was excised out using cautery. Specimen mammogram was obtained showing the clip to be well within the specimen. After ensuring hemostasis the gland was approximated with interrupted 2-0 Vicryl. Subcutaneous tissue also closed with 2-0 Vicryl. Skin closed with subcuticular 4-0 Vicryl reinforced with the Dermabond. Procedure was well-tolerated with no immediate problems encountered and patient subsequently returned recovery room stable condition.

## 2017-01-26 NOTE — Anesthesia Procedure Notes (Addendum)
Procedure Name: LMA Insertion Date/Time: 01/26/2017 10:05 AM Performed by: Nelda Marseille Pre-anesthesia Checklist: Patient identified, Patient being monitored, Timeout performed, Emergency Drugs available and Suction available Patient Re-evaluated:Patient Re-evaluated prior to inductionOxygen Delivery Method: Circle system utilized Preoxygenation: Pre-oxygenation with 100% oxygen Intubation Type: IV induction Ventilation: Mask ventilation without difficulty LMA: LMA inserted LMA Size: 3.5 Tube type: Oral Number of attempts: 1 Placement Confirmation: positive ETCO2 and breath sounds checked- equal and bilateral Tube secured with: Tape Dental Injury: Teeth and Oropharynx as per pre-operative assessment

## 2017-01-26 NOTE — H&P (View-Only) (Signed)
Patient ID: Kirsten Martin, female   DOB: November 22, 1975, 41 y.o.   MRN: [id number]  Chief Complaint  Patient presents with  . Follow-up    HPI Kirsten Martin is a 41 y.o. female.  Here today for follow up left breat biopsy. She states the area is sore. Slight nipple discharge last week.  Interpreter, Chip Boer present for interview, exam and discussion.  HPI  Past Medical History:  Diagnosis Date  . Breast mass, left 2018    Past Surgical History:  Procedure Laterality Date  . BREAST BIOPSY Left 11/22/2016   [place] bx of 2 areas. FRAGMENTS OF SCLEROSING INTRADUCTAL PAPILLOMA.  Marland Kitchen BREAST BIOPSY Left 12/07/2016   PSEUDO-ANGIOMATOUS STROMAL HYPERPLASIA  . OOPHORECTOMY Left 2012   [facility]    No family history on file.  Social History Social History  Substance Use Topics  . Smoking status: Never Smoker  . Smokeless tobacco: Never Used  . Alcohol use No    No Known Allergies  No current outpatient prescriptions on file.   No current facility-administered medications for this visit.     Review of Systems Review of Systems  Constitutional: Negative.   Respiratory: Negative.   Cardiovascular: Negative.     Blood pressure 124/86, pulse 76, resp. rate 12, height 5' (1.524 m), weight 199 lb (90.3 kg), last menstrual period 11/30/2016.  Physical Exam Physical Exam  Constitutional: She is oriented to person, place, and time. She appears well-developed and well-nourished.  HENT:  Mouth/Throat: Oropharynx is clear and moist.  Eyes: Conjunctivae are normal. No scleral icterus.  Neck: Neck supple.  Cardiovascular: Normal rate, regular rhythm and normal heart sounds.   Pulmonary/Chest: Effort normal and breath sounds normal. Right breast exhibits no inverted nipple, no mass, no nipple discharge, no skin change and no tenderness. Left breast exhibits no inverted nipple, no mass, no nipple discharge, no skin change and no tenderness.  Abdominal: Soft.  Lymphadenopathy:    She has  no cervical adenopathy.    She has no axillary adenopathy.  Neurological: She is alert and oriented to person, place, and time.  Skin: Skin is warm and dry.  Psychiatric: Her behavior is normal.  Hematoma from recent left breast biopsy at 11-12 ocl location has resolved  Data Reviewed Progress notes and previous pathology.   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   Pt is agreeable.  HPI, Physical Exam, Assessment and Plan have been scribed under the direction and in the presence of Mckinley Jewel, MD  Karie Fetch, RN I have completed the exam and reviewed the above documentation for accuracy and completeness.  I agree with the above.  Haematologist has been used and any errors in dictation or transcription are unintentional.  Seeplaputhur G. Jamal Collin, M.D., F.A.C.S.   Junie Panning G 01/17/2017, 8:45 AM

## 2017-01-26 NOTE — Anesthesia Preprocedure Evaluation (Signed)
Anesthesia Evaluation  Patient identified by MRN, date of birth, ID band Patient awake    Reviewed: Allergy & Precautions, H&P , NPO status , Patient's Chart, lab work & pertinent test results  History of Anesthesia Complications Negative for: history of anesthetic complications  Airway Mallampati: III  TM Distance: >3 FB Neck ROM: full    Dental  (+) Poor Dentition   Pulmonary neg pulmonary ROS, neg shortness of breath,    Pulmonary exam normal breath sounds clear to auscultation       Cardiovascular Exercise Tolerance: Good negative cardio ROS Normal cardiovascular exam Rhythm:regular Rate:Normal     Neuro/Psych negative neurological ROS  negative psych ROS   GI/Hepatic negative GI ROS, Neg liver ROS, neg GERD  ,  Endo/Other  negative endocrine ROS  Renal/GU      Musculoskeletal   Abdominal   Peds  Hematology negative hematology ROS (+)   Anesthesia Other Findings Past Medical History: 2018: Breast mass, left  Past Surgical History: 11/22/2016: BREAST BIOPSY Left     Comment: [place] bx of 2 areas. FRAGMENTS OF SCLEROSING               INTRADUCTAL PAPILLOMA. 12/07/2016: BREAST BIOPSY Left     Comment: PSEUDO-ANGIOMATOUS STROMAL HYPERPLASIA 2012: OOPHORECTOMY Left     Comment: [facility]     Reproductive/Obstetrics negative OB ROS                             Anesthesia Physical Anesthesia Plan  ASA: II  Anesthesia Plan: General   Post-op Pain Management:    Induction: Intravenous  Airway Management Planned: Natural Airway and Nasal Cannula  Additional Equipment:   Intra-op Plan:   Post-operative Plan:   Informed Consent: I have reviewed the patients History and Physical, chart, labs and discussed the procedure including the risks, benefits and alternatives for the proposed anesthesia with the patient or authorized representative who has indicated his/her understanding and  acceptance.   Dental Advisory Given  Plan Discussed with: Anesthesiologist, CRNA and Surgeon  Anesthesia Plan Comments: (Consent via interpreter   Patient consented for risks of anesthesia including but not limited to:  - adverse reactions to medications - damage to teeth, lips or other oral mucosa - sore throat or hoarseness - Damage to heart, brain, lungs or loss of life  Patient voiced understanding.)        Anesthesia Quick Evaluation

## 2017-01-26 NOTE — Discharge Instructions (Signed)

## 2017-01-26 NOTE — Anesthesia Postprocedure Evaluation (Signed)
Anesthesia Post Note  Patient: Kirsten Martin  Procedure(s) Performed: Procedure(s) (LRB): BREAST LUMPECTOMY WITH NEEDLE LOCALIZATION (Left)  Patient location during evaluation: PACU Anesthesia Type: General Level of consciousness: awake and alert Pain management: pain level controlled Vital Signs Assessment: post-procedure vital signs reviewed and stable Respiratory status: spontaneous breathing, nonlabored ventilation, respiratory function stable and patient connected to nasal cannula oxygen Cardiovascular status: blood pressure returned to baseline and stable Postop Assessment: no signs of nausea or vomiting Anesthetic complications: no     Last Vitals:  Vitals:   01/26/17 1159 01/26/17 1208  BP:  (!) 144/87  Pulse: 68 83  Resp: 11 16  Temp:  36.1 C    Last Pain:  Vitals:   01/26/17 1208  PainSc: 0-No pain                 Precious Haws Piscitello

## 2017-01-26 NOTE — Anesthesia Post-op Follow-up Note (Cosign Needed)
Anesthesia QCDR form completed.        

## 2017-01-29 LAB — SURGICAL PATHOLOGY

## 2017-01-30 ENCOUNTER — Telehealth: Payer: Self-pay | Admitting: *Deleted

## 2017-01-30 NOTE — Telephone Encounter (Signed)
Notified patient as instructed, patient pleased. Discussed follow-up appointments, patient agrees  

## 2017-01-30 NOTE — Telephone Encounter (Signed)
-----   Message from Christene Lye, MD sent at 01/30/2017  7:19 AM EDT ----- Please inform pt- pathology is benign.

## 2017-01-31 ENCOUNTER — Ambulatory Visit (INDEPENDENT_AMBULATORY_CARE_PROVIDER_SITE_OTHER): Payer: PRIVATE HEALTH INSURANCE | Admitting: General Surgery

## 2017-01-31 ENCOUNTER — Encounter: Payer: Self-pay | Admitting: General Surgery

## 2017-01-31 VITALS — BP 110/72 | HR 66 | Resp 12 | Ht 60.0 in | Wt 196.0 lb

## 2017-01-31 DIAGNOSIS — D369 Benign neoplasm, unspecified site: Secondary | ICD-10-CM

## 2017-01-31 NOTE — Progress Notes (Signed)
Patient ID: Kirsten Martin, female   DOB: 07-10-1976, 41 y.o.   MRN: [id number]  Chief Complaint  Patient presents with  . Routine Post Op    HPI Kirsten Martin is a 41 y.o. female.  Here today for postoperative visit, left lumpectomy on 01-26-17, she states she is doing well. She did notice a small amount of bleeding at incision after her shower today. Denies any gastrointestinal issues, bowels are moving regular. Interpreter, Herb Grays,  present for interview, exam and discussion.  HPI  Past Medical History:  Diagnosis Date  . Breast mass, left 2018    Past Surgical History:  Procedure Laterality Date  . BREAST BIOPSY Left 11/22/2016   [place] bx of 2 areas. FRAGMENTS OF SCLEROSING INTRADUCTAL PAPILLOMA.  Marland Kitchen BREAST BIOPSY Left 12/07/2016   PSEUDO-ANGIOMATOUS STROMAL HYPERPLASIA  . BREAST LUMPECTOMY WITH NEEDLE LOCALIZATION Left 01/26/2017   Procedure: BREAST LUMPECTOMY WITH NEEDLE LOCALIZATION;  Surgeon: Christene Lye, MD;  Location: [facility] ORS;  Service: General;  Laterality: Left;  . OOPHORECTOMY Left 2012   [facility]    No family history on file.  Social History Social History  Substance Use Topics  . Smoking status: Never Smoker  . Smokeless tobacco: Never Used  . Alcohol use No    No Known Allergies  Current Outpatient Prescriptions  Medication Sig Dispense Refill  . ibuprofen (ADVIL,MOTRIN) 200 MG tablet Take 200-400 mg by mouth every 6 (six) hours as needed for headache or mild pain (depends on pain if takes 1-2 tablets).     No current facility-administered medications for this visit.     Review of Systems Review of Systems  Constitutional: Negative.   Respiratory: Negative.   Cardiovascular: Negative.     Blood pressure 110/72, pulse 66, resp. rate 12, height 5' (1.524 m), weight 196 lb (88.9 kg), last menstrual period 12/28/2016.  Physical Exam Physical Exam  Constitutional: She is oriented to person, place, and time. She appears well-developed  and well-nourished.  Pulmonary/Chest: Right breast exhibits no inverted nipple, no mass, no nipple discharge, no skin change and no tenderness. Left breast exhibits no inverted nipple, no mass, no nipple discharge, no skin change and no tenderness.    Neurological: She is alert and oriented to person, place, and time.  Skin: Skin is warm and dry.  Psychiatric: She has a normal mood and affect. Her behavior is normal.    Data Reviewed Prior notes reviewed.  Assessment    Status post lumpectomy of left breast for intraductal papilloma- doing well, small seroma- stable, follow up in September for left breast mammography Stable exam otherwise    Plan    Follow up in September for left breast mammography. Yearly mammography recommended every March after that. Advised to call with any questions or concerns.    HPI, Physical Exam, Assessment and Plan have been scribed under the direction and in the presence of Mckinley Jewel, MD  Karie Fetch, RN  I have completed the exam and reviewed the above documentation for accuracy and completeness.  I agree with the above.  Haematologist has been used and any errors in dictation or transcription are unintentional.  Romie Tay G. Jamal Collin, M.D., F.A.C.S.   Junie Panning G 01/31/2017, 4:08 PM

## 2017-01-31 NOTE — Patient Instructions (Signed)
Follow up in September for left breast mammography. Yearly mammography recommended every March after that. Advised to call with any questions or concerns.

## 2017-02-08 ENCOUNTER — Other Ambulatory Visit: Payer: Self-pay | Admitting: *Deleted

## 2017-02-08 DIAGNOSIS — D242 Benign neoplasm of left breast: Secondary | ICD-10-CM

## 2017-02-27 ENCOUNTER — Encounter: Payer: Self-pay | Admitting: *Deleted

## 2017-02-27 ENCOUNTER — Other Ambulatory Visit: Payer: Self-pay | Admitting: *Deleted

## 2017-02-27 DIAGNOSIS — N63 Unspecified lump in unspecified breast: Secondary | ICD-10-CM

## 2017-02-27 NOTE — Progress Notes (Signed)
Letter mailed to inform patient of her 6 month follow-up mammogram on 05/22/17 @ 10:40.  She is to follow-up with Dr. Jamal Collin also.  [facility] to [place].

## 2017-04-23 ENCOUNTER — Telehealth: Payer: Self-pay

## 2017-04-23 NOTE — Telephone Encounter (Signed)
Message left by interpreter services for the patient to call back to schedule a follow up appointment for after her mammogram on 05/22/17.

## 2017-05-22 ENCOUNTER — Ambulatory Visit
Admission: RE | Admit: 2017-05-22 | Discharge: 2017-05-22 | Disposition: A | Discharge status: Home / Self Care | Payer: Self-pay | Source: Ambulatory Visit | Attending: Oncology | Admitting: Oncology

## 2017-05-22 DIAGNOSIS — D242 Benign neoplasm of left breast: Secondary | ICD-10-CM

## 2017-05-22 DIAGNOSIS — N63 Unspecified lump in unspecified breast: Secondary | ICD-10-CM

## 2017-05-29 ENCOUNTER — Ambulatory Visit: Payer: PRIVATE HEALTH INSURANCE | Admitting: General Surgery

## 2017-06-15 ENCOUNTER — Encounter: Payer: Self-pay | Admitting: *Deleted

## 2017-06-15 NOTE — Progress Notes (Signed)
Letter mailed from the [facility] to inform patient of her normal mammogram results.  Patient is to follow-up with annual screening in 6 months.  HSIS to [place].

## 2017-06-26 IMAGING — MG MM BREAST SURGICAL SPECIMEN
1 series · 1 of 1 positions shown · non-contrast
Comparison: Previous exam(s).

CLINICAL DATA: Evaluate specimen

EXAM:
SPECIMEN RADIOGRAPH OF THE LEFT BREAST

[L SPECIMEN]
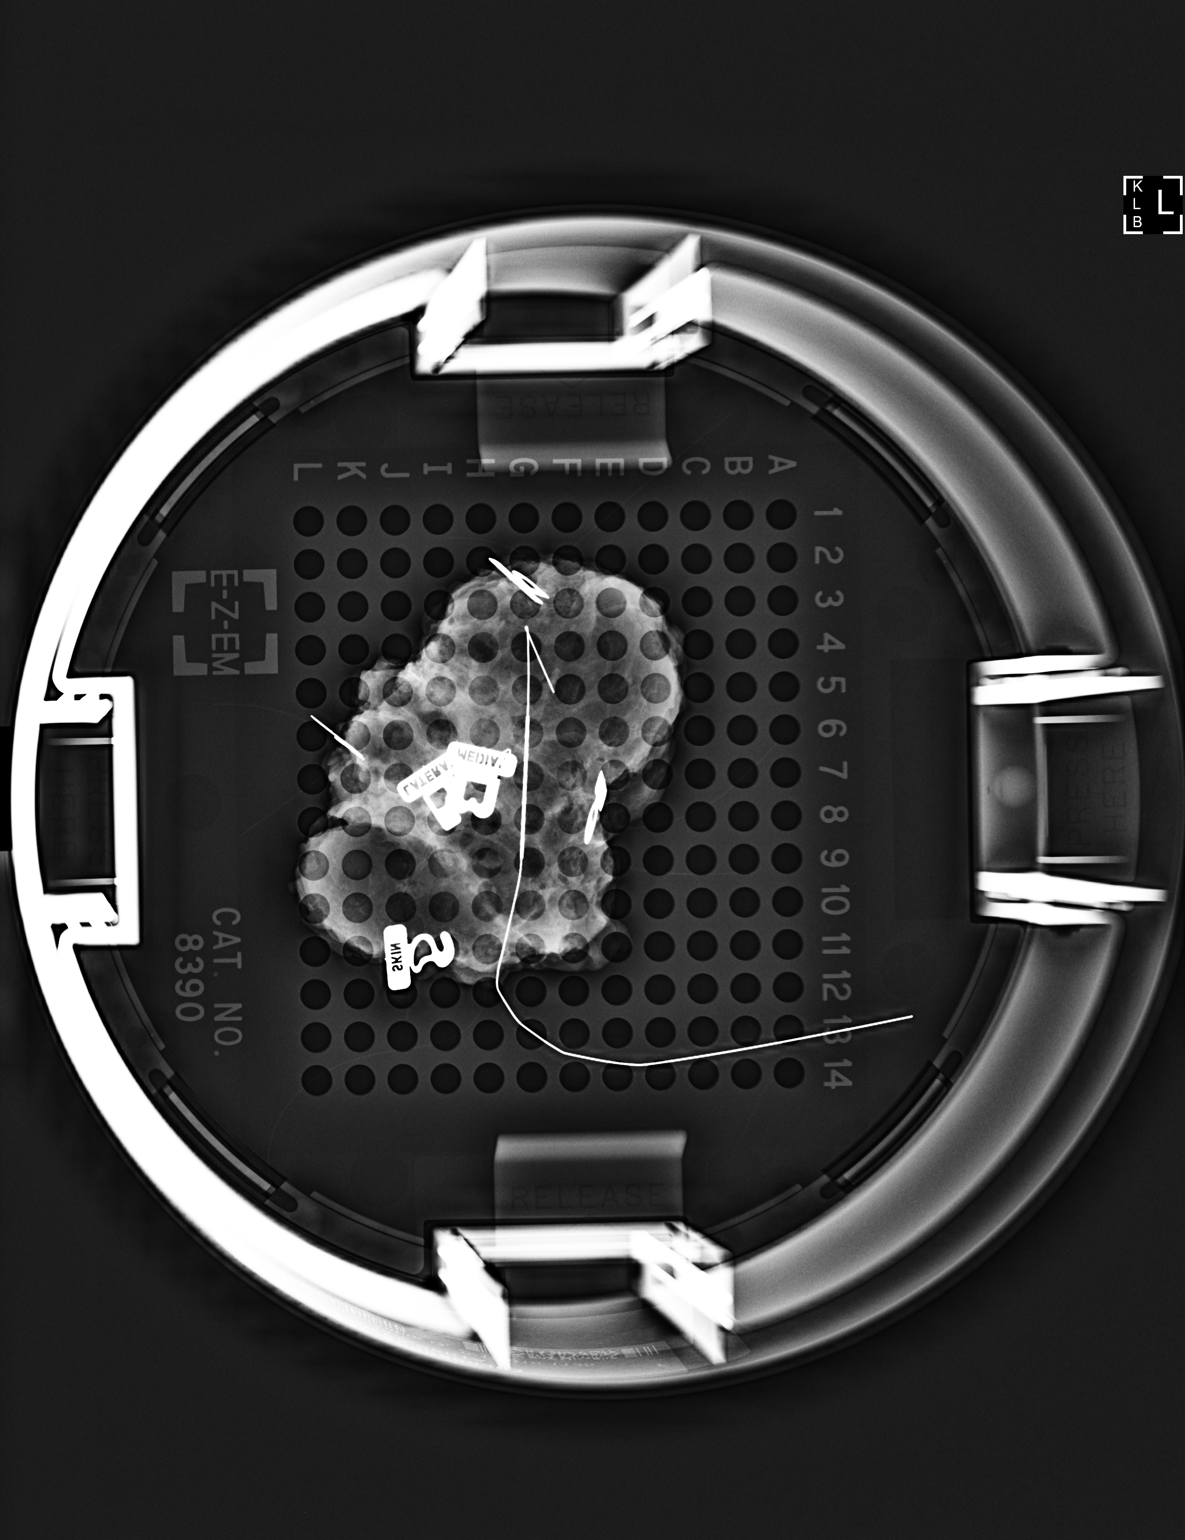

[1 of 1 positions shown; findings below may reference images not displayed]

FINDINGS: Status post excision of the left breast. The wire tip and biopsy
marker clip are present and are marked for pathology.
IMPRESSION: Specimen radiograph of the left breast.

## 2017-07-05 ENCOUNTER — Telehealth: Payer: Self-pay

## 2017-07-05 NOTE — Telephone Encounter (Signed)
Message left via Interpreter Services for the patient to call back to reschedule her follow up appointment with Dr Jamal Collin.

## 2017-07-12 ENCOUNTER — Encounter: Payer: Self-pay | Admitting: *Deleted

## 2017-08-13 ENCOUNTER — Encounter: Payer: Self-pay | Admitting: General Surgery

## 2017-08-13 ENCOUNTER — Ambulatory Visit (INDEPENDENT_AMBULATORY_CARE_PROVIDER_SITE_OTHER): Payer: Self-pay | Admitting: General Surgery

## 2017-08-13 VITALS — BP 130/78 | HR 78 | Resp 12 | Ht 60.0 in | Wt 188.0 lb

## 2017-08-13 DIAGNOSIS — D369 Benign neoplasm, unspecified site: Secondary | ICD-10-CM

## 2017-08-13 DIAGNOSIS — N6310 Unspecified lump in the right breast, unspecified quadrant: Secondary | ICD-10-CM

## 2017-08-13 NOTE — Patient Instructions (Addendum)
    Patient to return to the [facility] to get mammogram in March 2019 and breast checks yearly. The patient is aware to call back for any questions or concerns.

## 2017-08-13 NOTE — Progress Notes (Signed)
Patient ID: Kirsten Martin, female   DOB: 1976-01-25, 41 y.o.   MRN: [id number]  Chief Complaint  Patient presents with  . Follow-up    HPI Kirsten Martin is a 41 y.o. female who presents for a breast evaluation. The most recent left breast mammogram was done on 05/22/2017. She states her right breast she feels some burning near the nipple, this has been going on for three weeks. This week no problems.  Patient reports that the discharge from the left nipple has stopped completely following the lumpectomy on the left Patient does perform regular self breast checks and gets regular mammograms done.  Interpreter, Ronnald Collum  Is present at visit   HPI  Past Medical History:  Diagnosis Date  . Breast mass, left 2018    Past Surgical History:  Procedure Laterality Date  . BREAST BIOPSY Left 11/22/2016   [place] bx of 2 areas. 11:00 coil shape-PASH, 12:00 wing shape-papilloma  . BREAST BIOPSY Left 12/07/2016   UIQ cylinder shape-PASH  . BREAST LUMPECTOMY Left 01/26/2017   papilloma excised-wing shape marker removed.  Clear margins.   Marland Kitchen BREAST LUMPECTOMY WITH NEEDLE LOCALIZATION Left 01/26/2017   Procedure: BREAST LUMPECTOMY WITH NEEDLE LOCALIZATION;  Surgeon: Christene Lye, MD;  Location: [facility] ORS;  Service: General;  Laterality: Left;  . OOPHORECTOMY Left 2012   [facility]    History reviewed. No pertinent family history.  Social History Social History   Tobacco Use  . Smoking status: Never Smoker  . Smokeless tobacco: Never Used  Substance Use Topics  . Alcohol use: No  . Drug use: No    No Known Allergies  Current Outpatient Medications  Medication Sig Dispense Refill  . ibuprofen (ADVIL,MOTRIN) 200 MG tablet Take 200-400 mg by mouth every 6 (six) hours as needed for headache or mild pain (depends on pain if takes 1-2 tablets).     No current facility-administered medications for this visit.     Review of Systems Review of Systems  Constitutional: Negative.    Respiratory: Negative.   Cardiovascular: Negative.     Blood pressure 130/78, pulse 78, resp. rate 12, height 5' (1.524 m), weight 188 lb (85.3 kg).  Physical Exam Physical Exam  Constitutional: She is oriented to person, place, and time. She appears well-developed and well-nourished.  Eyes: Conjunctivae are normal. No scleral icterus.  Neck: Neck supple.  Cardiovascular: Normal rate, regular rhythm and normal heart sounds.  Pulmonary/Chest: Effort normal and breath sounds normal. Right breast exhibits no inverted nipple, no mass, no nipple discharge, no skin change and no tenderness. Left breast exhibits no inverted nipple, no mass, no nipple discharge, no skin change and no tenderness.  Lymphadenopathy:    She has no cervical adenopathy.    She has no axillary adenopathy.  Neurological: She is alert and oriented to person, place, and time.  Skin: Skin is warm and dry.    Data Reviewed Mammogram reviewed   Assessment    Stable exam, Left breast intraductal papilloma.    Plan       Patient to return to the [facility] to get mammogram in March 2019 and breast checks yearly. The patient is aware to call back for any questions or concerns.  HPI, Physical Exam, Assessment and Plan have been scribed under the direction and in the presence of Mckinley Jewel, MD  Gaspar Cola, CMA  I have completed the exam and reviewed the above documentation for accuracy and completeness.  I agree with the above.  Haematologist  has been used and any errors in dictation or transcription are unintentional.  Seeplaputhur G. Jamal Collin, M.D., F.A.C.S.  Junie Panning G 08/13/2017, 1:36 PM

## 2018-07-01 ENCOUNTER — Ambulatory Visit
Admission: RE | Admit: 2018-07-01 | Discharge: 2018-07-01 | Disposition: A | Discharge status: Home / Self Care | Payer: Self-pay | Source: Ambulatory Visit | Attending: Oncology | Admitting: Oncology

## 2018-07-01 ENCOUNTER — Encounter (INDEPENDENT_AMBULATORY_CARE_PROVIDER_SITE_OTHER): Payer: Self-pay

## 2018-07-01 ENCOUNTER — Encounter: Payer: Self-pay | Admitting: *Deleted

## 2018-07-01 ENCOUNTER — Ambulatory Visit: Payer: Self-pay | Attending: Oncology | Admitting: *Deleted

## 2018-07-01 VITALS — BP 126/87 | HR 76 | Temp 98.2°F | Ht 61.0 in | Wt 197.0 lb

## 2018-07-01 DIAGNOSIS — Z0000 Encounter for general adult medical examination without abnormal findings: Secondary | ICD-10-CM | POA: Insufficient documentation

## 2018-07-01 NOTE — Progress Notes (Signed)
  Subjective:     Patient ID: Kirsten Martin, female   DOB: Jan 14, 1976, 42 y.o.   MRN: [id number]  HPI   Review of Systems     Objective:   Physical Exam  Pulmonary/Chest: Right breast exhibits no inverted nipple, no mass, no nipple discharge, no skin change and no tenderness. Left breast exhibits no inverted nipple, no mass, no nipple discharge, no skin change and no tenderness.         Assessment:     42 year old Hispanic female returns to [facility] for annual screening.  Kirsten Martin, the interpreter present during the interview and exam.  Clinical breast exam unremarkable.  Taught self breast awareness.  Last menstrual cycle was July 2019.  Patient is sexually active and not using birth control.  She does not think she is pregnant.  Discussed risk of radiation exposure to an unborn fetus.  She is to go and get a pregnancy test completed now and if negative take the results to the breast center prior to her mammogram today.  She is agreeable.  Last pap on 11/01/16 was negative/ negative.  Next pap due in 2023. Patient has been screened for eligibility.  She does not have any insurance, Medicare or Medicaid.  She also meets financial eligibility.  Hand-out given on the Affordable Care Act.   Risk Assessment    Risk Scores      07/01/2018   Last edited by: Rico Junker, RN   5-year risk: 0.5 %   Lifetime risk: 5.5 %            Plan:     Patient is to call me if her pregnancy test is positive and we will reschedule her to return to [facility] next year.  Order for screening mammogram in place if pregnancy test is negative.  Will follow up per [facility] protocol.

## 2018-07-01 NOTE — Patient Instructions (Signed)
Gave patient hand-out, Women Staying Healthy, Active and Well from [facility], with education on breast health, pap smears, heart and colon health. 

## 2018-07-03 NOTE — Progress Notes (Signed)
Letter mailed from the [facility] to inform patient of her normal mammogram results.  Patient is to follow-up with annual screening in one year.  HSIS to [place]. 

## 2019-06-25 ENCOUNTER — Other Ambulatory Visit: Payer: Self-pay

## 2019-06-25 DIAGNOSIS — Z20822 Contact with and (suspected) exposure to covid-19: Secondary | ICD-10-CM

## 2019-06-26 LAB — NOVEL CORONAVIRUS, NAA: SARS-CoV-2, NAA: NOT DETECTED

## 2019-08-26 ENCOUNTER — Ambulatory Visit: Payer: Self-pay | Attending: Internal Medicine

## 2019-08-26 DIAGNOSIS — Z20822 Contact with and (suspected) exposure to covid-19: Secondary | ICD-10-CM

## 2019-08-26 DIAGNOSIS — Z20828 Contact with and (suspected) exposure to other viral communicable diseases: Secondary | ICD-10-CM | POA: Insufficient documentation

## 2019-08-27 LAB — NOVEL CORONAVIRUS, NAA: SARS-CoV-2, NAA: NOT DETECTED

## 2019-10-27 NOTE — Progress Notes (Signed)
Patient pre-screened for [facility] eligibility due to COVID 19 precautions. Two patient identifiers used for verification that I was speaking to correct patient.  Patient to Present directly to [facility] 10/28/19 for [facility] screening mammogram.  Risk Assessment    Risk Scores      10/28/2019 07/01/2018   Last edited by: Theodore Demark, RN Rico Junker, RN   5-year risk: 0.6 % 0.5 %   Lifetime risk: 5.5 % 5.5 %

## 2019-10-28 ENCOUNTER — Ambulatory Visit
Admission: RE | Admit: 2019-10-28 | Discharge: 2019-10-28 | Disposition: A | Discharge status: Home / Self Care | Payer: Self-pay | Source: Ambulatory Visit | Attending: Oncology | Admitting: Oncology

## 2019-10-28 ENCOUNTER — Ambulatory Visit: Payer: Self-pay | Attending: Oncology

## 2019-10-28 DIAGNOSIS — Z0000 Encounter for general adult medical examination without abnormal findings: Secondary | ICD-10-CM

## 2019-10-29 NOTE — Progress Notes (Signed)
Letter mailed from [facility] to notify of normal mammogram results.  Patient to return in one year for annual screening.  Copy to HSIS. 

## 2020-03-27 IMAGING — MG DIGITAL SCREENING BILAT W/ TOMO W/ CAD
8 series · 8 of 24 positions shown · non-contrast
Comparison: Previous exam(s).

CLINICAL DATA: Screening.

EXAM:
DIGITAL SCREENING BILATERAL MAMMOGRAM WITH TOMO AND CAD

[R MLO synth-2D]
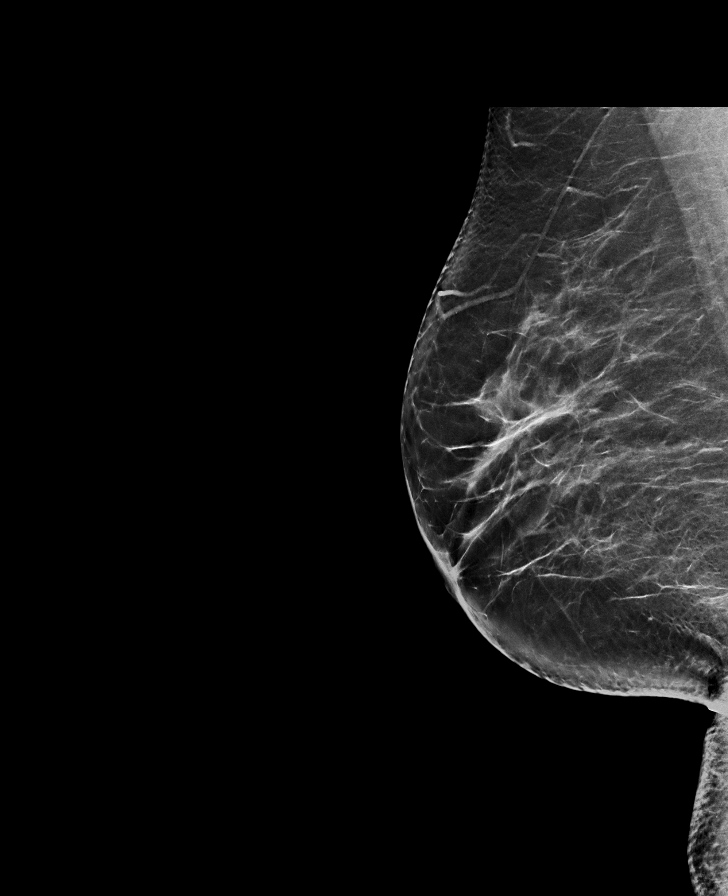

[R CC synth-2D]
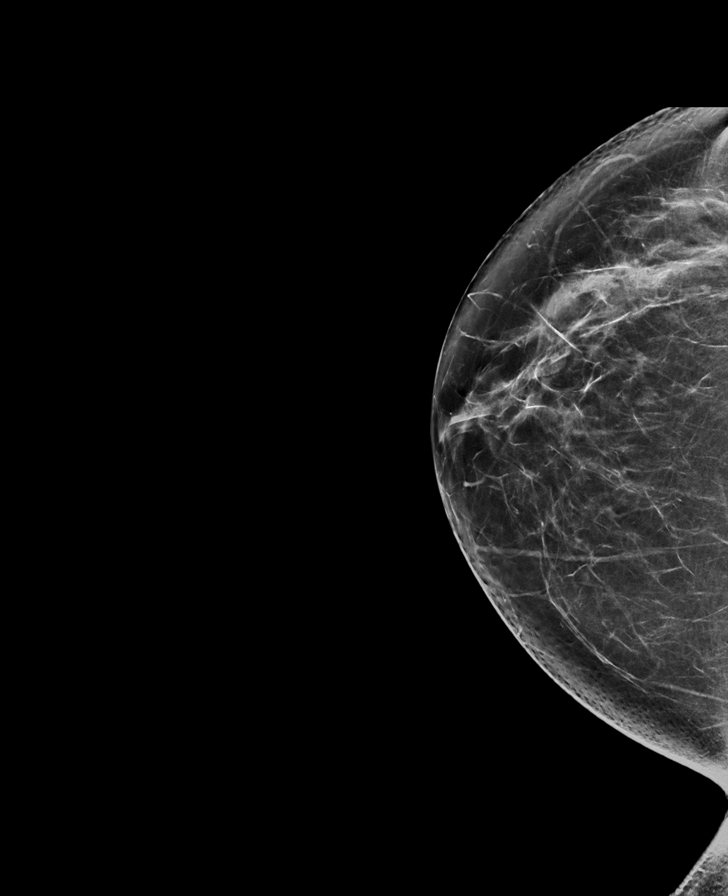

[L CC synth-2D]
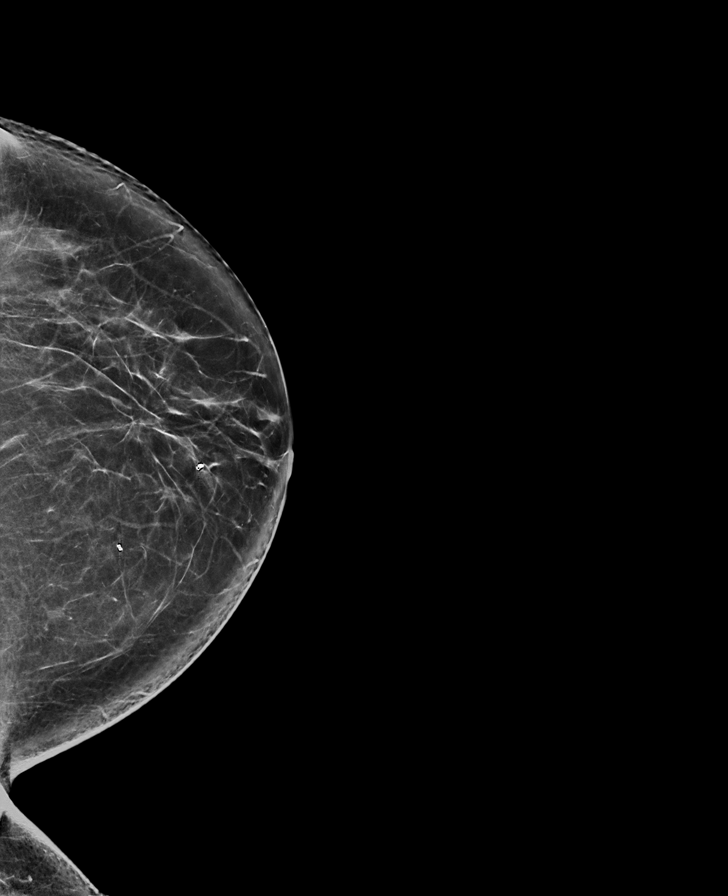

[L MLO synth-2D]
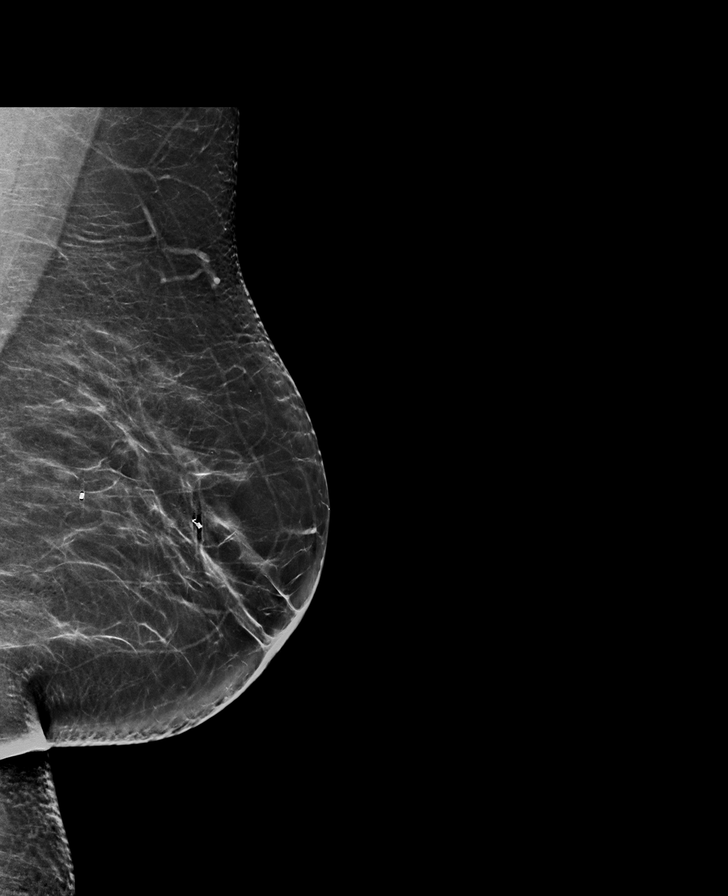

[R MLO tomo · tomo slice 41/80.0]
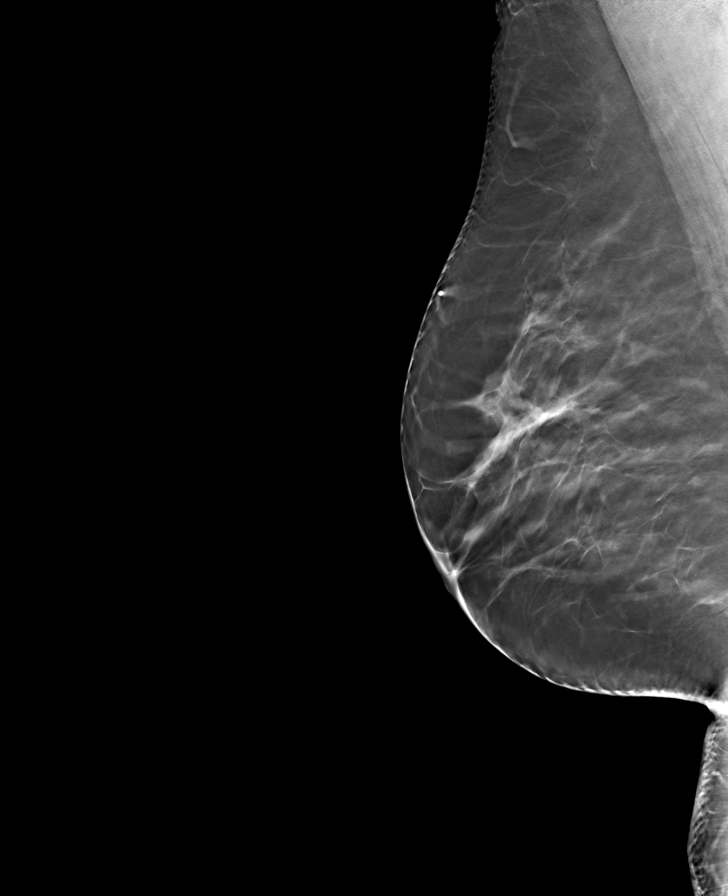

[L MLO tomo · tomo slice 40/79.0]
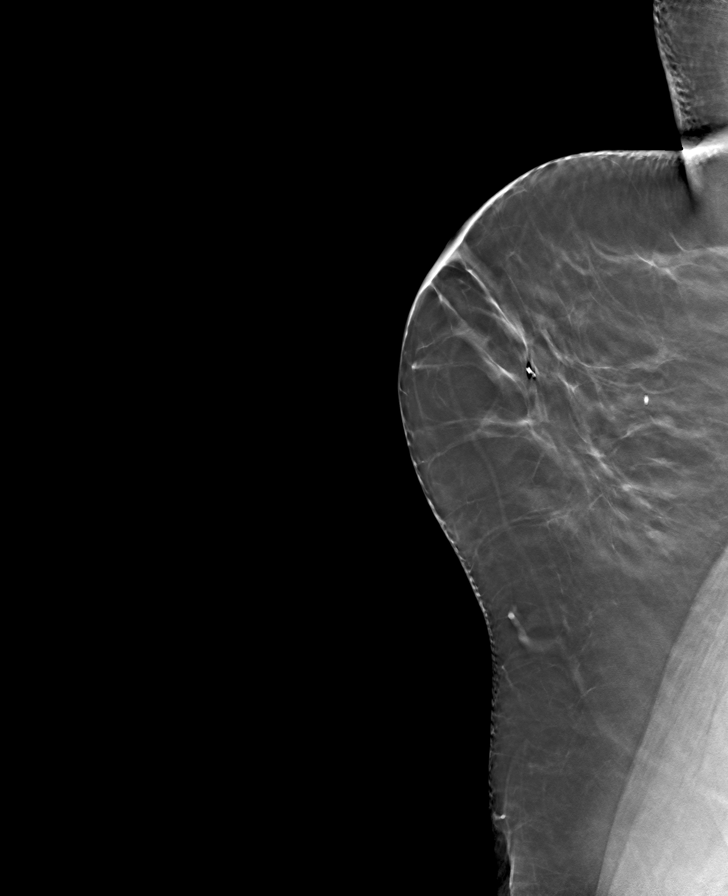

[R CC tomo · tomo slice 40/79.0]
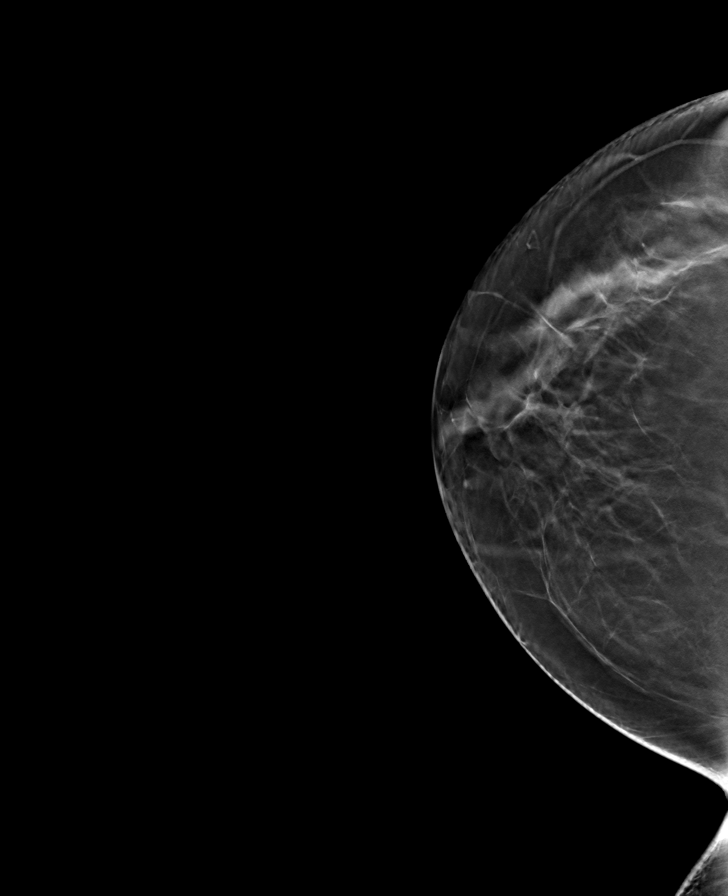

[L CC tomo · tomo slice 39/77.0]
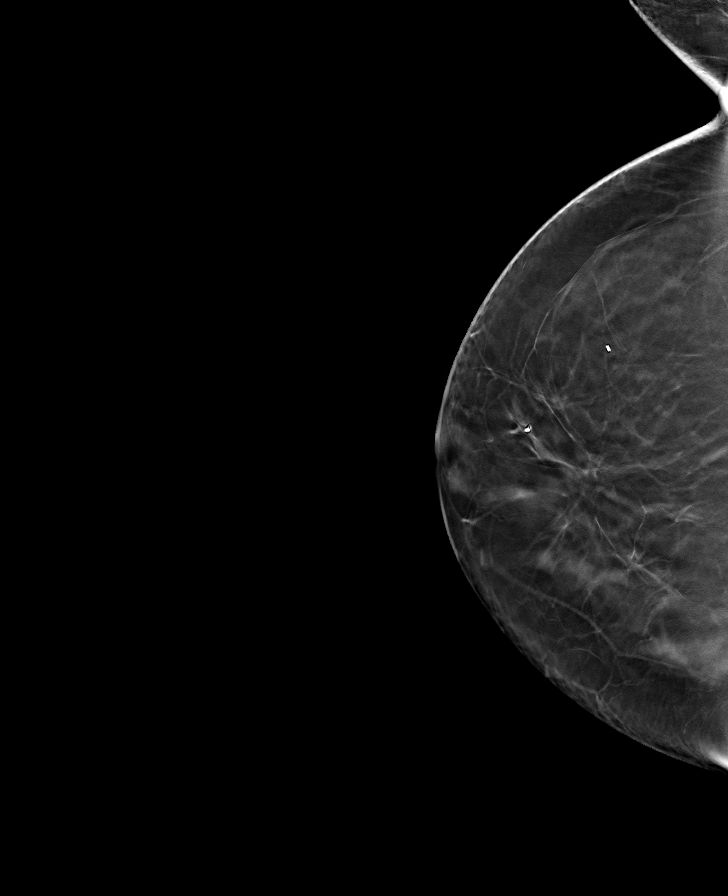

[8 of 24 positions shown; findings below may reference images not displayed]

ACR Breast Density Category b: There are scattered areas of
fibroglandular density.
FINDINGS: There are no findings suspicious for malignancy. Images were
processed with CAD.
IMPRESSION: No mammographic evidence of malignancy. A result letter of this
screening mammogram will be mailed directly to the patient.

RECOMMENDATION:
Screening mammogram in one year. (Code:[id number])

BI-RADS CATEGORY  1: Negative.

## 2020-11-03 ENCOUNTER — Ambulatory Visit: Payer: Self-pay | Attending: Oncology

## 2020-12-07 ENCOUNTER — Ambulatory Visit
Admission: RE | Admit: 2020-12-07 | Discharge: 2020-12-07 | Disposition: A | Discharge status: Home / Self Care | Payer: Self-pay | Source: Ambulatory Visit | Attending: Oncology | Admitting: Oncology

## 2020-12-07 ENCOUNTER — Ambulatory Visit: Payer: Self-pay | Attending: Oncology | Admitting: *Deleted

## 2020-12-07 ENCOUNTER — Other Ambulatory Visit: Payer: Self-pay

## 2020-12-07 VITALS — BP 124/64 | HR 69 | Temp 97.6°F | Ht 61.81 in | Wt 206.8 lb

## 2020-12-07 DIAGNOSIS — Z0000 Encounter for general adult medical examination without abnormal findings: Secondary | ICD-10-CM | POA: Insufficient documentation

## 2020-12-07 NOTE — Patient Instructions (Signed)
Gave patient hand-out, Women Staying Healthy, Active and Well from [facility], with education on breast health, pap smears, heart and colon health. 

## 2020-12-07 NOTE — Progress Notes (Signed)
  Subjective:     Patient ID: Kirsten Martin, female   DOB: 01-19-1976, 45 y.o.   MRN: [id number].   [facility] Medical History Record - 12/07/20 1025      Breast History   Screening cycle New    Provider (C[facility]) none    Initial Mammogram 12/07/20    Last Mammogram Annual    Last Mammogram Date 10/28/19    Provider (Mammogram)  [facility] Poli    Recent Breast Symptoms None      Breast Cancer History   Breast Cancer History No personal or family history      Previous History of Breast Problems   Breast Surgery or Biopsy Left   Benign ([place]) tumor. Saw Dr. Jamal Collin   Breast Implants N/A    BSE Done Monthly      Gynecological/Obstetrical History   LMP --   12 years   Is there any chance that the client could be pregnant?  No    Age at menarche 18    Age at menopause 12 years ago    PAP smear history Annually    Date of last PAP  11/01/16    Provider (PAP) [facility]    Age at first live birth 27    Breast fed children Yes (type length in comments)   2 years   DES Exposure No    Cervical, Uterine or Ovarian cancer No    Family history of Cervial, Uterine or Ovarian cancer No    Hysterectomy No    Cervix removed No    Ovaries removed No    Laser/Cryosurgery No    Current method of birth control None    Current method of Estrogen/Hormone replacement None    Smoking history None    Comments No insurance            Review of Systems     Objective:   Physical Exam Chest:  Breasts:     Right: No swelling, bleeding, inverted nipple, mass, nipple discharge, skin change, tenderness or supraclavicular adenopathy.     Left: No swelling, bleeding, inverted nipple, mass, nipple discharge, skin change, tenderness or supraclavicular adenopathy.     Lymphadenopathy:     Upper Body:     Right upper body: No supraclavicular adenopathy.     Left upper body: No supraclavicular adenopathy.        Assessment:     45 year old Hispanic female returns to [facility] for annual screening.  [employer]  video interpreter Glenard Haring, [phone] present during the interview and exam.  Clinical breast exam  unremarkable.  Taught self breast awareness.  Last pap on 11/01/16 was negative / negative.  Next pap due in 2023.  Patient has been screened for eligibility.  She does not have any insurance, Medicare or Medicaid.  She also meets financial eligibility.   Risk Assessment    Risk Scores      12/07/2020 10/28/2019   Last edited by: Theodore Demark, RN Theodore Demark, RN   5-year risk: 0.6 % 0.6 %   Lifetime risk: 5.2 % 5.5 %            Plan:     Screening mammogram ordered.  Will follow up per [facility] protocol.

## 2020-12-09 ENCOUNTER — Other Ambulatory Visit: Payer: Self-pay | Admitting: *Deleted

## 2020-12-09 DIAGNOSIS — N6489 Other specified disorders of breast: Secondary | ICD-10-CM

## 2020-12-16 ENCOUNTER — Other Ambulatory Visit: Payer: Self-pay

## 2020-12-16 ENCOUNTER — Ambulatory Visit
Admission: RE | Admit: 2020-12-16 | Discharge: 2020-12-16 | Disposition: A | Discharge status: Home / Self Care | Payer: Self-pay | Source: Ambulatory Visit | Attending: Oncology | Admitting: Oncology

## 2020-12-16 DIAGNOSIS — N6489 Other specified disorders of breast: Secondary | ICD-10-CM

## 2020-12-16 NOTE — Progress Notes (Signed)
Radiologist discussed [place] 1 mammogram results with patient.  Patient  Instructed to return for annual screening  Copy to HSIS.

## 2020-12-22 ENCOUNTER — Encounter: Payer: Self-pay | Admitting: *Deleted

## 2020-12-22 NOTE — Progress Notes (Signed)
Letter mailed from [facility] to inform patient of her normal mammogram results.  Patient is to follow-up with annual screening in one year. 

## 2021-05-16 IMAGING — US US BREAST*R* LIMITED INC AXILLA
1 series · 5 of 5 positions shown · non-contrast
Comparison: Previous exam(s).

CLINICAL DATA: Right outer breast distortion questioned on
patient's most recent screening mammogram.

EXAM:
DIGITAL DIAGNOSTIC UNILATERAL RIGHT MAMMOGRAM WITH TOMOSYNTHESIS AND
CAD; ULTRASOUND RIGHT BREAST LIMITED
TECHNIQUE: Right digital diagnostic mammography and breast tomosynthesis was
performed. The images were evaluated with computer-aided detection.;
Targeted ultrasound examination of the right breast was performed

[Series 1: us breast*right* limited inc axilla · 0.08mm/px · 5 of 5 slices shown]
[im 1/5]
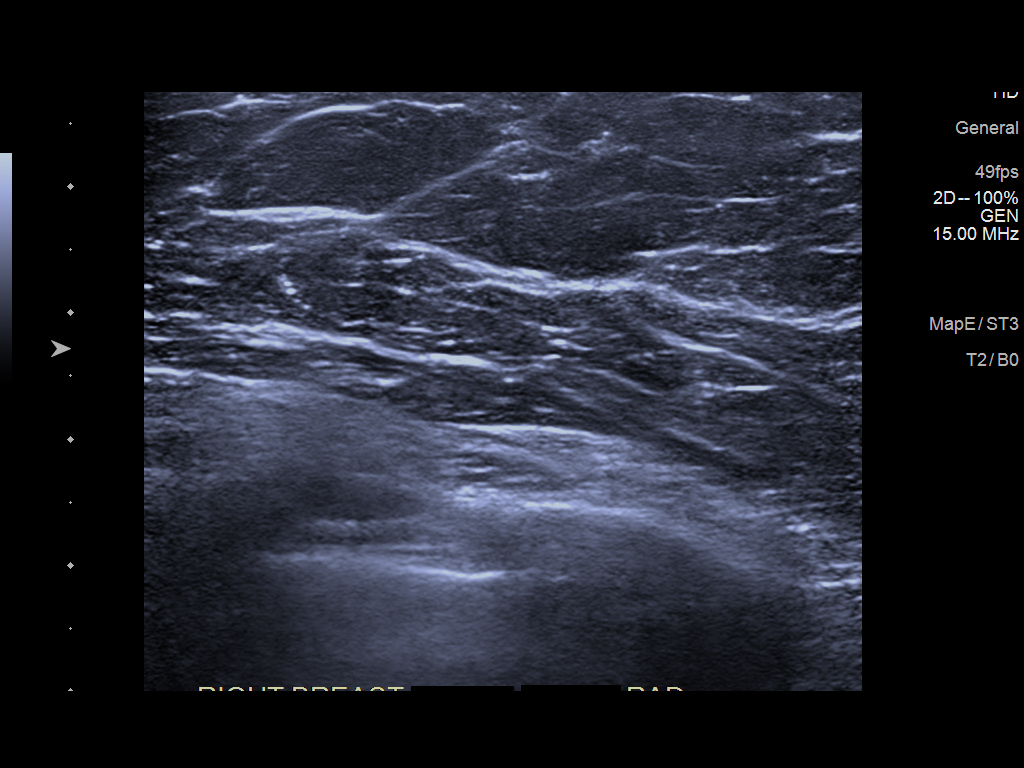
[im 2/5]
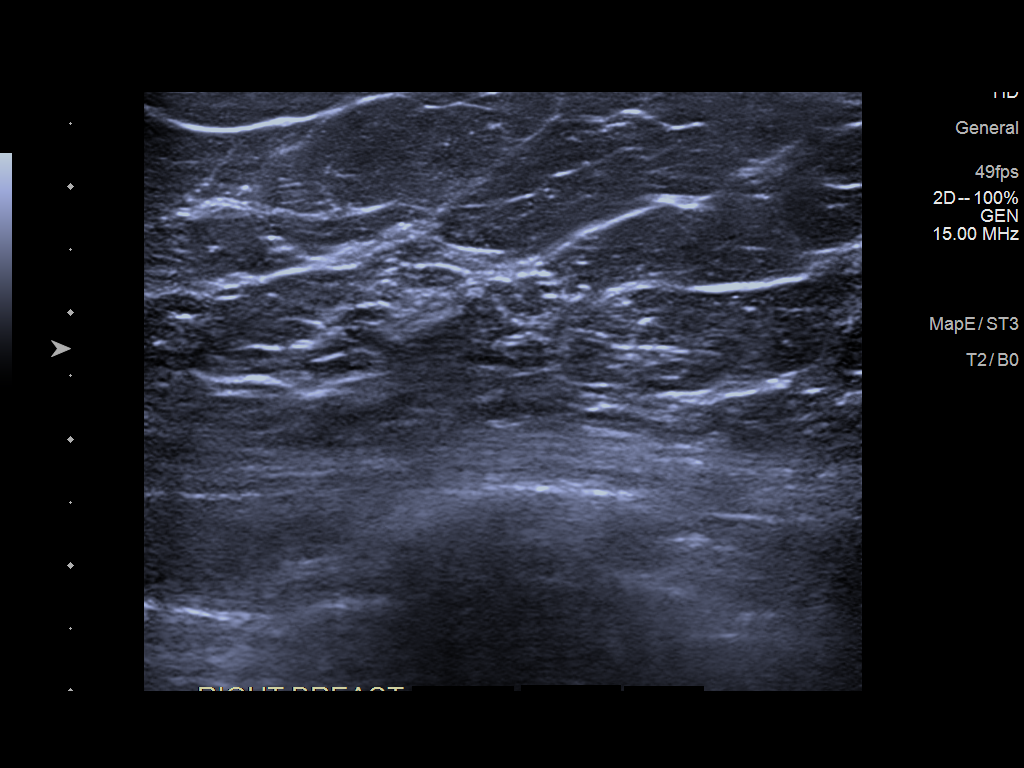
[im 3/5]
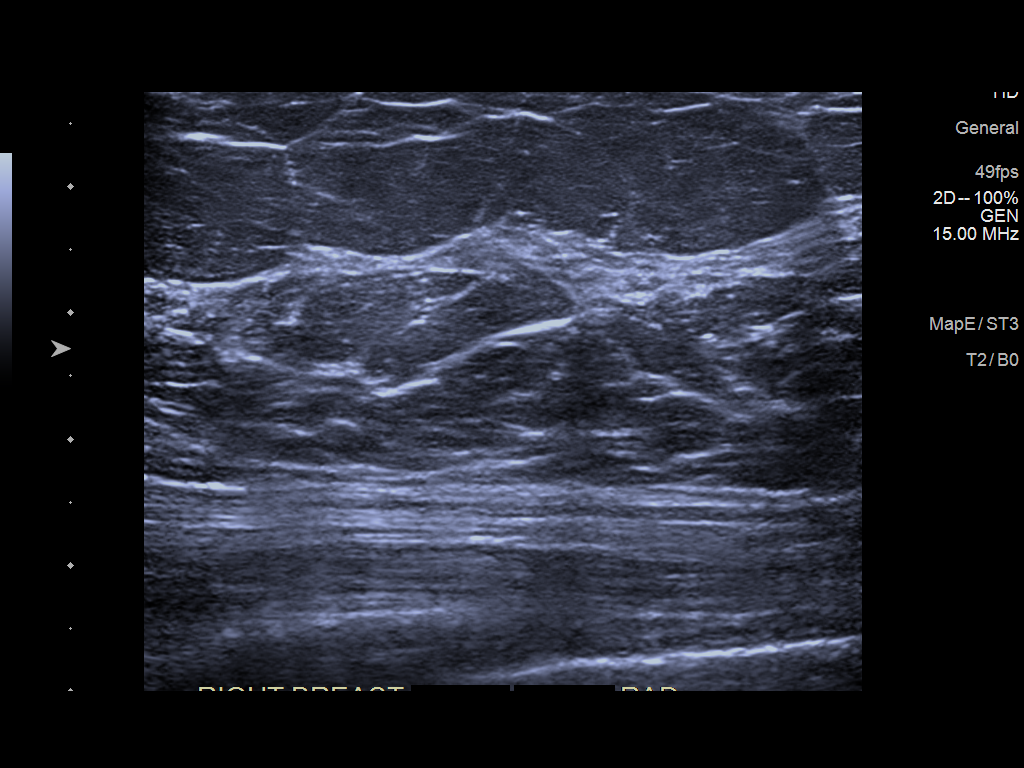
[im 4/5]
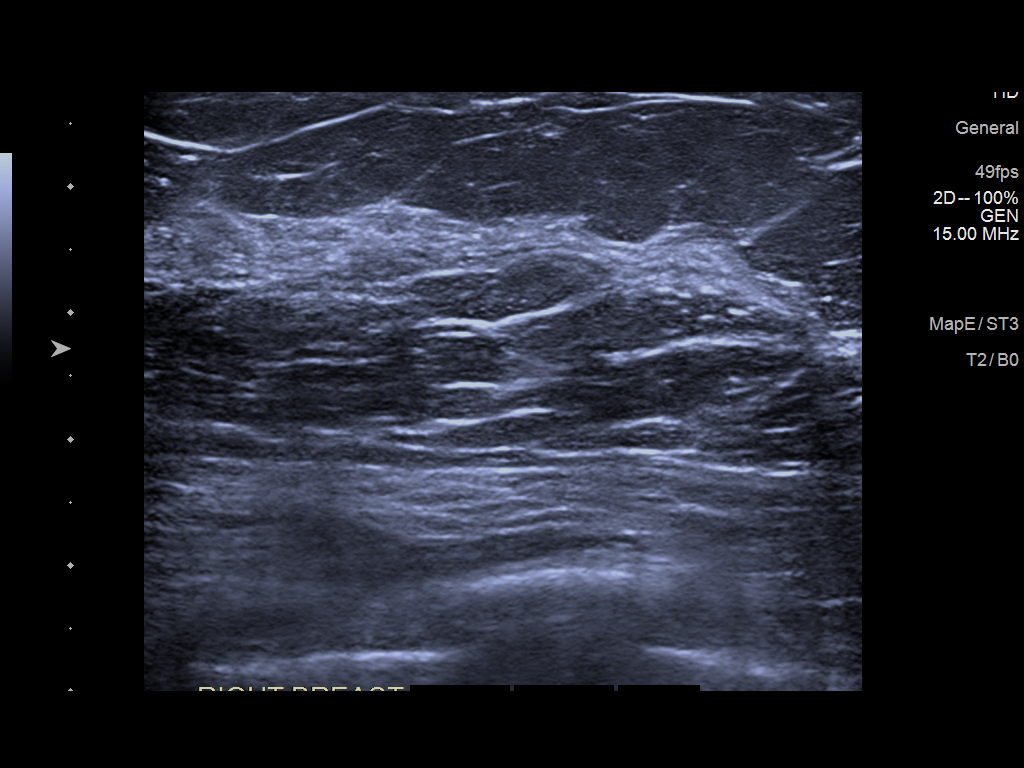
[im 5/5]
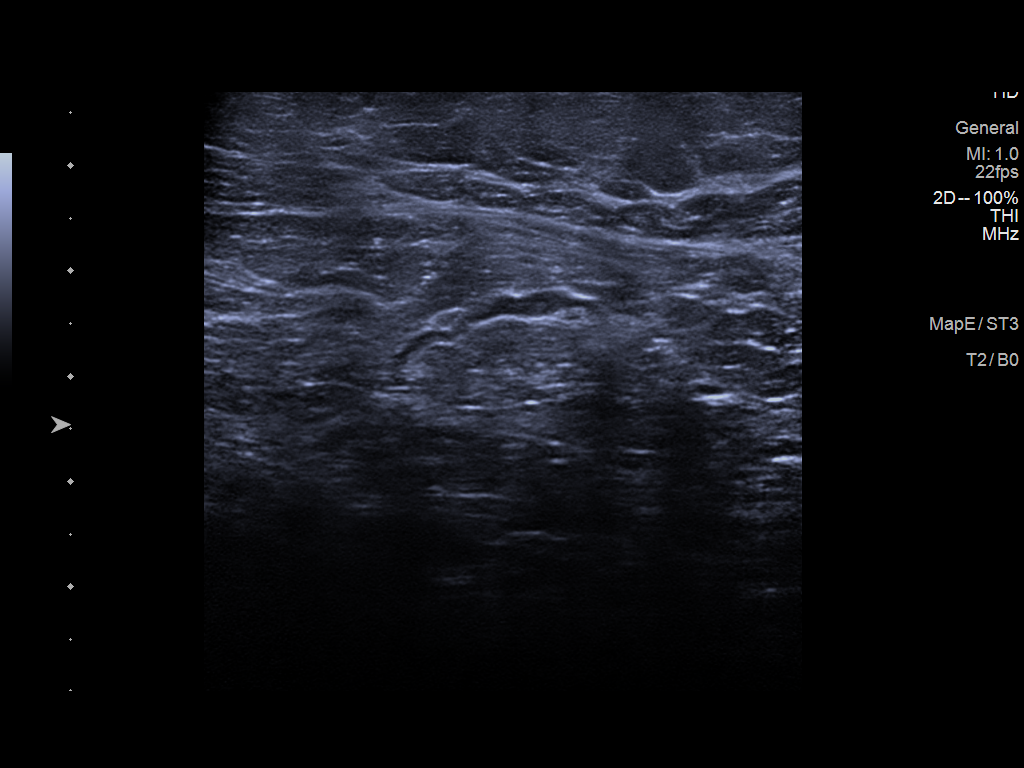

[5 of 5 positions shown; findings below may reference images not displayed]

ACR Breast Density Category b: There are scattered areas of
fibroglandular density.
FINDINGS: Additional mammographic views of the right breast demonstrate no
suspicious masses or areas of architectural distortion. The
previously questioned distortion in the outer right breast, middle
to posterior depth, effaces to glandular tissue.

Targeted right outer breast ultrasound demonstrates no suspicious
masses or shadowing lesions.
IMPRESSION: No mammographic or sonographic evidence of right breast malignancy.

RECOMMENDATION:
Screening mammogram in one year.(Code:H7-5-9QC)

I have discussed the findings and recommendations with the patient.
If applicable, a reminder letter will be sent to the patient
regarding the next appointment.

BI-RADS CATEGORY  1: Negative.

## 2022-04-26 ENCOUNTER — Other Ambulatory Visit: Payer: Self-pay

## 2022-04-26 DIAGNOSIS — Z1231 Encounter for screening mammogram for malignant neoplasm of breast: Secondary | ICD-10-CM

## 2022-05-02 ENCOUNTER — Encounter: Payer: Self-pay | Admitting: *Deleted

## 2022-05-02 ENCOUNTER — Other Ambulatory Visit: Payer: Self-pay

## 2022-05-02 ENCOUNTER — Ambulatory Visit: Payer: Self-pay | Attending: Hematology and Oncology | Admitting: *Deleted

## 2022-05-02 ENCOUNTER — Ambulatory Visit
Admission: RE | Admit: 2022-05-02 | Discharge: 2022-05-02 | Disposition: A | Discharge status: Home / Self Care | Payer: Self-pay | Source: Ambulatory Visit | Attending: Obstetrics and Gynecology | Admitting: Obstetrics and Gynecology

## 2022-05-02 VITALS — BP 114/83 | Wt 213.0 lb

## 2022-05-02 DIAGNOSIS — Z1211 Encounter for screening for malignant neoplasm of colon: Secondary | ICD-10-CM

## 2022-05-02 DIAGNOSIS — Z1231 Encounter for screening mammogram for malignant neoplasm of breast: Secondary | ICD-10-CM | POA: Insufficient documentation

## 2022-05-02 DIAGNOSIS — Z01419 Encounter for gynecological examination (general) (routine) without abnormal findings: Secondary | ICD-10-CM

## 2022-05-02 NOTE — Progress Notes (Signed)
[name] is a 47 y.o. No obstetric history on file. female who presents to [facility] clinic today with no complaints. Patient presents with her husband for clinical breast exam, pap and mammogram.    Pap Smear: Pap smear completed today. Last Pap smear was 11/01/16 at [facility] clinic and was  negative / negative . Per patient has no history of an abnormal Pap smear. Last Pap smear result is available in Epic.   Physical exam: Breasts Physical Exam Exam conducted with a chaperone present.  Chest:  Breasts:    Right: Inverted nipple present. No swelling, bleeding, mass, nipple discharge, skin change or tenderness.     Left: Inverted nipple present. No swelling, bleeding, mass, nipple discharge, skin change or tenderness.        Comments: Inverted nipples normal per patient Abdominal:     Palpations: There is no hepatomegaly or splenomegaly.  Genitourinary:    Exam position: Lithotomy position.     Labia:        Right: No rash, tenderness, lesion or injury.        Left: No rash, tenderness, lesion or injury.      Urethra: No prolapse or urethral pain.     Vagina: No signs of injury and foreign body. No vaginal discharge, erythema, tenderness, bleeding, lesions or prolapsed vaginal walls.     Cervix: No cervical motion tenderness, discharge, friability, lesion, erythema or cervical bleeding.     Uterus: Not deviated, not enlarged, not fixed, not tender and no uterine prolapse.      Adnexa:        Right: No mass.         Left: No mass.            Comments: Unable to completed rectal exam due to patient being so tense Lymphadenopathy:     Upper Body:     Right upper body: No supraclavicular or axillary adenopathy.     Left upper body: No supraclavicular or axillary adenopathy.       Smoking History: Patient has never smoked    Patient Navigation: Patient education provided. Access to services provided for patient through [place], the [facility] interpreter  provided interpretation. No transportation provided   Colorectal Cancer Screening: Per patient has never had colonoscopy completed. No complaints today. Patient was given a FIT test with instructions.   Breast and Cervical Cancer Risk Assessment: Patient does not have family history of breast cancer, known genetic mutations, or radiation treatment to the chest before age 60. Patient does not have history of cervical dysplasia, immunocompromised, or DES exposure in-utero.  Risk Assessment   No risk assessment data for the current encounter  Risk Scores       12/07/2020   Last edited by: Theodore Demark, RN   5-year risk: 0.6 %   Lifetime risk: 5.2 %           Risk Assessment     Risk Scores       05/02/2022 12/07/2020   Last edited by: Demetrius Revel, LPN Theodore Demark, RN   5-year risk: 0.6 % 0.6 %   Lifetime risk: 5.1 % 5.2 %              A: [facility] exam with pap smear   P: Referred patient to the [facility] for a screening mammogram. Appointment scheduled today.  Will follow up per B[facility]CP protocol.  Rico Junker, RN 05/02/2022 9:39 AM

## 2022-05-02 NOTE — Progress Notes (Deleted)
  Subjective:     Patient ID: Kirsten Martin, female   DOB: 08-07-1976, 46 y.o.   MRN: [id number]  HPI   Review of Systems     Objective:   Physical Exam Exam conducted with a chaperone present.  Chest:  Breasts:    Right: Inverted nipple present. No swelling, bleeding, mass, nipple discharge, skin change or tenderness.     Left: Inverted nipple present. No swelling, bleeding, mass, nipple discharge, skin change or tenderness.       Comments: Inverted nipples normal per patient Abdominal:     Palpations: There is no hepatomegaly or splenomegaly.  Genitourinary:    Exam position: Lithotomy position.     Labia:        Right: No rash, tenderness, lesion or injury.        Left: No rash, tenderness, lesion or injury.      Urethra: No prolapse or urethral pain.     Vagina: No signs of injury and foreign body. No vaginal discharge, erythema, tenderness, bleeding, lesions or prolapsed vaginal walls.     Cervix: No cervical motion tenderness, discharge, friability, lesion, erythema or cervical bleeding.     Uterus: Not deviated, not enlarged, not fixed, not tender and no uterine prolapse.      Adnexa:        Right: No mass.         Left: No mass.          Comments: Unable to completed rectal exam due to patient being so tense Lymphadenopathy:     Upper Body:     Right upper body: No supraclavicular or axillary adenopathy.     Left upper body: No supraclavicular or axillary adenopathy.      Assessment:     ***    Plan:     ***

## 2022-05-05 LAB — CYTOLOGY - PAP
Adequacy: ABSENT
Comment: NEGATIVE
Diagnosis: NEGATIVE
High risk HPV: NEGATIVE

## 2022-05-11 ENCOUNTER — Telehealth: Payer: Self-pay

## 2022-05-11 NOTE — Telephone Encounter (Signed)
Via Lavon Paganini, Spanish Interpreter [facility]), Patient informed negative Pap/HPV results, next pap in due in 5 years. Patient verbalized understanding.

## 2023-11-15 ENCOUNTER — Other Ambulatory Visit: Payer: Self-pay

## 2023-11-15 DIAGNOSIS — Z1231 Encounter for screening mammogram for malignant neoplasm of breast: Secondary | ICD-10-CM

## 2023-12-10 ENCOUNTER — Ambulatory Visit: Payer: Self-pay | Attending: Hematology and Oncology | Admitting: Hematology and Oncology

## 2023-12-10 ENCOUNTER — Ambulatory Visit
Admission: RE | Admit: 2023-12-10 | Discharge: 2023-12-10 | Disposition: A | Discharge status: Home / Self Care | Payer: Self-pay | Source: Ambulatory Visit | Attending: Obstetrics and Gynecology | Admitting: Obstetrics and Gynecology

## 2023-12-10 VITALS — BP 107/89 | Wt 203.5 lb

## 2023-12-10 DIAGNOSIS — Z1231 Encounter for screening mammogram for malignant neoplasm of breast: Secondary | ICD-10-CM | POA: Insufficient documentation

## 2023-12-10 NOTE — Patient Instructions (Signed)
 Taught Hang Ammon about self breast awareness and gave educational materials to take home. Patient did not need a Pap smear today due to last Pap smear was in 05/02/2022 per patient. Told patient about free cervical cancer screenings to receive a Pap smear if would like one next year. Let her know [facility] will cover Pap smears every 5 years unless has a history of abnormal Pap smears. Referred patient to the [facility] for screening mammogram. Appointment scheduled for 12/10/2023. Patient aware of appointment and will be there. Let patient know will follow up with her within the next couple weeks with results. Kirsten Martin verbalized understanding.  Adelaide Adjutant, NP 10:20 AM

## 2023-12-10 NOTE — Progress Notes (Signed)
 [name] is a 48 y.o. female who presents to [facility] clinic today with no complaints.    Pap Smear: Pap not smear completed today. Last Pap smear was 05/02/2022 at [facility] clinic and was normal. Per patient has no history of an abnormal Pap smear. Last Pap smear result is available in Epic.   Physical exam: Breasts Breasts symmetrical. No skin abnormalities bilateral breasts. No nipple retraction bilateral breasts. No nipple discharge bilateral breasts. No lymphadenopathy. No lumps palpated bilateral breasts.       Pelvic/Bimanual Pap is not indicated today    Smoking History: Patient has never smoked and was not referred to quit line.    Patient Navigation: Patient education provided. Access to services provided for patient through B[facility]CP program. Kirsten Martin interpreter provided. No transportation provided   Colorectal Cancer Screening: Per patient has never had colonoscopy completed No complaints today.    Breast and Cervical Cancer Risk Assessment: Patient does not have family history of breast cancer, known genetic mutations, or radiation treatment to the chest before age 70. Patient does not have history of cervical dysplasia, immunocompromised, or DES exposure in-utero.  Risk Scores as of Encounter on 12/10/2023     Kirsten Martin           5-year 0.95%   Lifetime 8.83%   This patient is [place]/Lat[place] but has no documented birth country, so the [place] model used data from [place] patients to calculate their risk score. Document a birth country in the Demographics activity for a more accurate score.         Last calculated by Kirsten Ingle, LPN on 0/45/4098 at 10:31 AM          A: B[facility]CP exam without pap smear No complaints with benign exam.   P: Referred patient to the [facility] for a screening mammogram. Appointment scheduled 12/10/2023.  Adelaide Adjutant, NP 12/10/2023 10:19 AM

## 2023-12-13 ENCOUNTER — Encounter: Payer: Self-pay | Admitting: Obstetrics & Gynecology
